# Patient Record
Sex: Male | Born: 1965 | Race: White | Hispanic: No | Marital: Married | Smoking: Former smoker
Health system: Southern US, Community
[De-identification: ages and names within clinical notes are randomized; demographics above are authoritative.]

## PROBLEM LIST (undated history)

## (undated) DIAGNOSIS — Z8781 Personal history of (healed) traumatic fracture: Secondary | ICD-10-CM

## (undated) DIAGNOSIS — E7800 Pure hypercholesterolemia, unspecified: Secondary | ICD-10-CM

## (undated) DIAGNOSIS — S129XXA Fracture of neck, unspecified, initial encounter: Secondary | ICD-10-CM

## (undated) DIAGNOSIS — E785 Hyperlipidemia, unspecified: Secondary | ICD-10-CM

## (undated) DIAGNOSIS — Z720 Tobacco use: Secondary | ICD-10-CM

## (undated) DIAGNOSIS — I739 Peripheral vascular disease, unspecified: Secondary | ICD-10-CM

## (undated) DIAGNOSIS — S02609A Fracture of mandible, unspecified, initial encounter for closed fracture: Secondary | ICD-10-CM

## (undated) DIAGNOSIS — B019 Varicella without complication: Secondary | ICD-10-CM

## (undated) DIAGNOSIS — N200 Calculus of kidney: Secondary | ICD-10-CM

## (undated) HISTORY — DX: Tobacco use: Z72.0

## (undated) HISTORY — DX: Varicella without complication: B01.9

## (undated) HISTORY — DX: Peripheral vascular disease, unspecified: I73.9

## (undated) HISTORY — PX: CERVICAL FUSION: SHX112

## (undated) HISTORY — DX: Personal history of (healed) traumatic fracture: Z87.81

## (undated) HISTORY — DX: Hyperlipidemia, unspecified: E78.5

## (undated) HISTORY — DX: Fracture of mandible, unspecified, initial encounter for closed fracture: S02.609A

## (undated) HISTORY — DX: Calculus of kidney: N20.0

---
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
## 1988-10-20 DIAGNOSIS — N200 Calculus of kidney: Secondary | ICD-10-CM

## 1988-10-20 HISTORY — DX: Calculus of kidney: N20.0

## 1988-10-20 HISTORY — PX: OTHER SURGICAL HISTORY: SHX169

## 2002-10-20 HISTORY — PX: OTHER SURGICAL HISTORY: SHX169

## 2005-08-17 ENCOUNTER — Emergency Department (HOSPITAL_COMMUNITY): Admission: EM | Admit: 2005-08-17 | Discharge: 2005-08-17 | Payer: Self-pay | Admitting: Family Medicine

## 2005-10-20 HISTORY — PX: OTHER SURGICAL HISTORY: SHX169

## 2005-10-30 ENCOUNTER — Ambulatory Visit (HOSPITAL_COMMUNITY): Admission: RE | Admit: 2005-10-30 | Discharge: 2005-10-31 | Payer: Self-pay | Admitting: Neurosurgery

## 2006-10-18 IMAGING — CR DG SPINE 1V PORT
1 series · 1 of 1 positions shown · non-contrast
Comparison: none

CLINICAL DATA: Localization for C5-6 fusion.
 PORTABLE SPINE ? 1 VIEW:

[view not recorded]
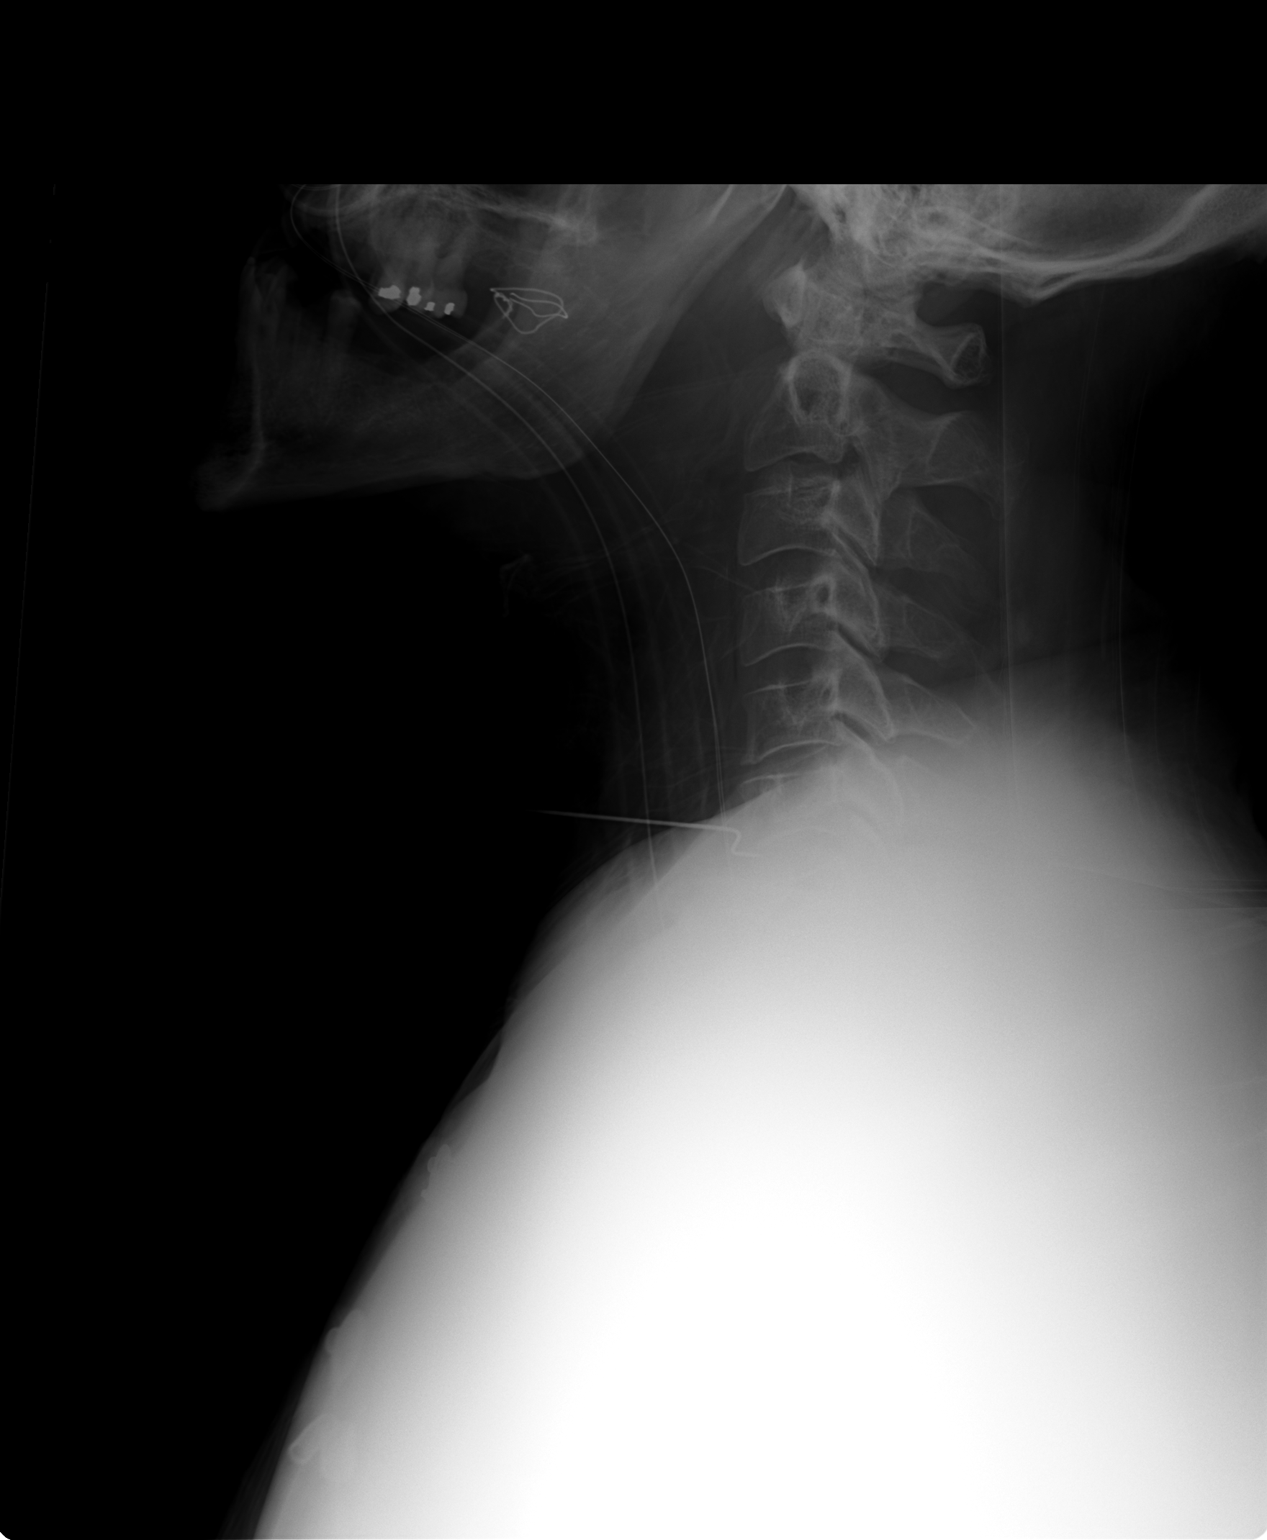

[1 of 1 positions shown; findings below may reference images not displayed]

FINDINGS: Film #1 at 2153 hours shows the C6-7 interspace is marked from an anterior approach.
IMPRESSION: C6-7 localized. 
 PORTABLE SPINE ? 1 VIEW:
FINDINGS: Film #2 at 0873 hours shows a needle now positioned anterior to the C5-6 disc space.
IMPRESSION: C5-6 localized.

## 2006-10-18 IMAGING — CR DG SPINE 1V PORT
1 series · 1 of 1 positions shown · non-contrast
Comparison: none

CLINICAL DATA: C5-6 ACDF.  HNP.  Radiculopathy.  
 PORTABLE CERVICAL SPINE VIEW AT [DATE] HOURS ? 1 VIEW:

[view not recorded]
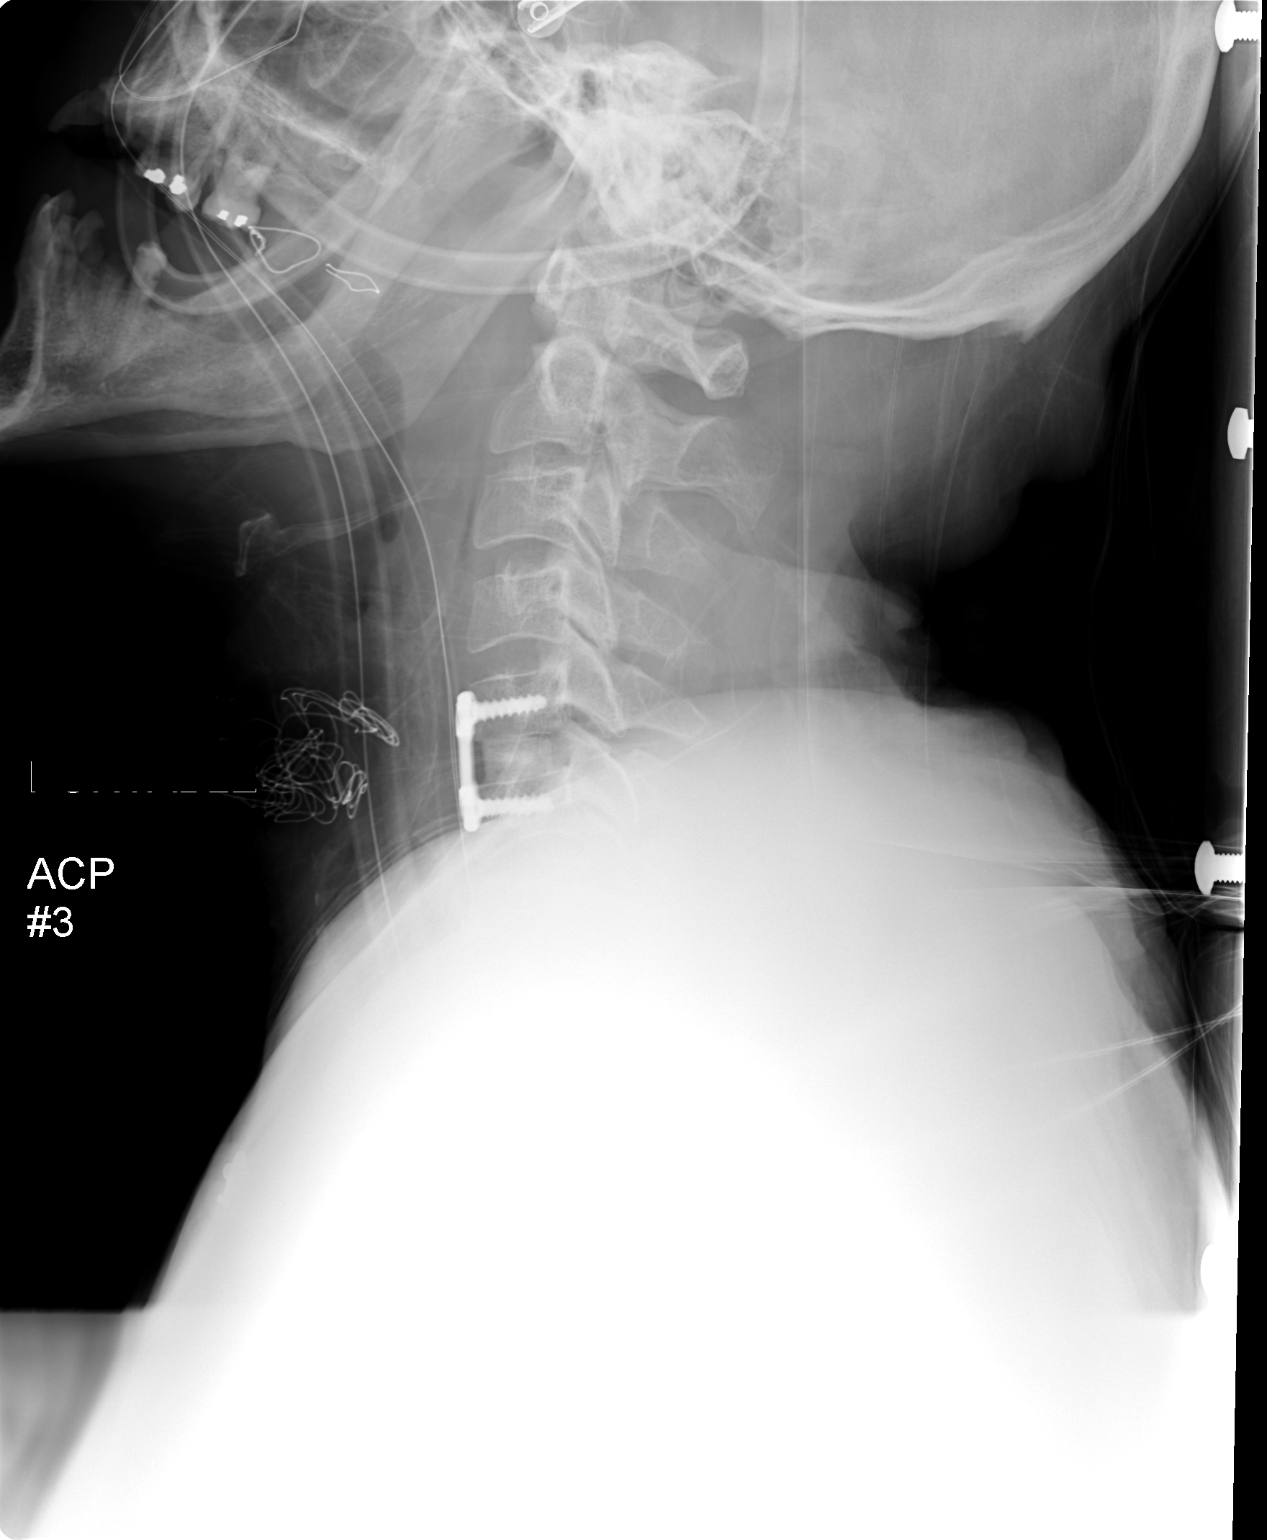

[1 of 1 positions shown; findings below may reference images not displayed]

FINDINGS: This film is labeled number three and reveals satisfactory appearance of anterior plate and screw fixation hardware and interbody fusion plug at C5-6.  No subluxation.
IMPRESSION: Satisfactory appearance of ACDF at C5-6.

## 2006-10-18 IMAGING — CR DG SPINE 1V PORT
1 series · 1 of 1 positions shown · non-contrast
Comparison: none

CLINICAL DATA: Localization for C5-6 fusion.
 PORTABLE SPINE ? 1 VIEW:

[view not recorded]
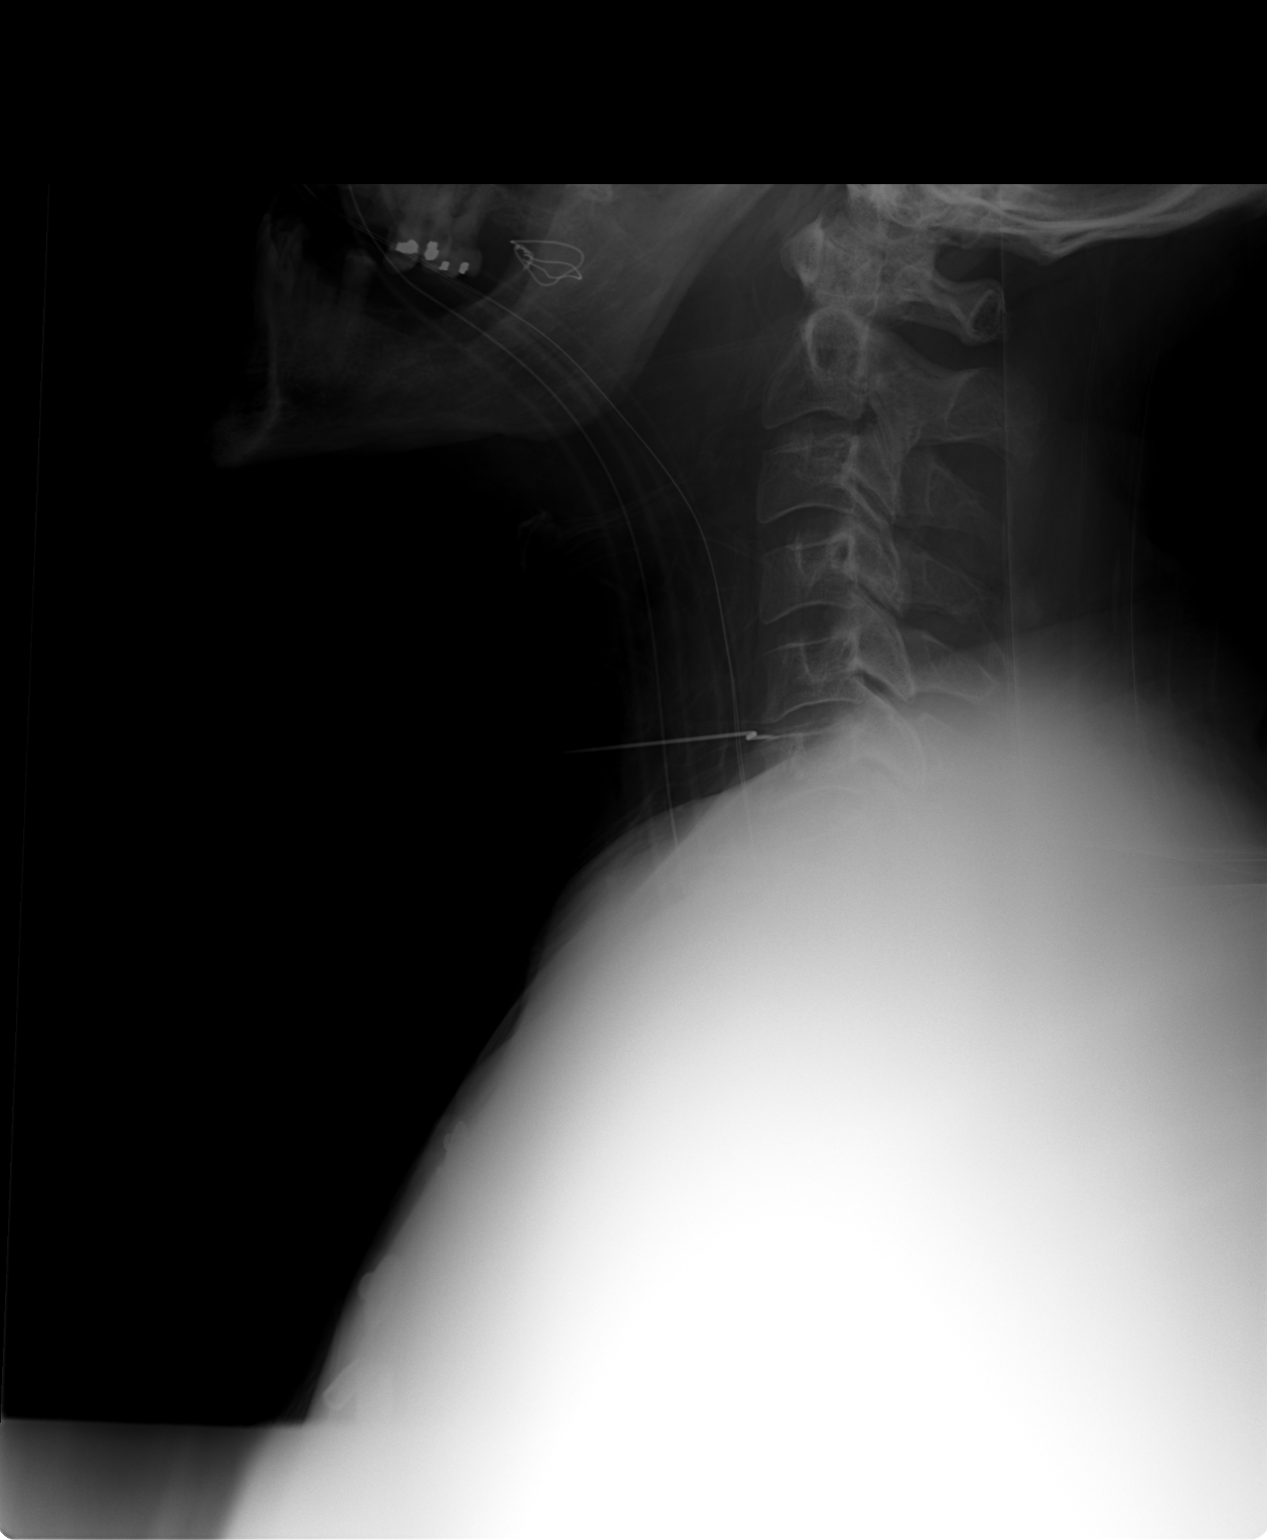

[1 of 1 positions shown; findings below may reference images not displayed]

FINDINGS: Film #1 at 2153 hours shows the C6-7 interspace is marked from an anterior approach.
IMPRESSION: C6-7 localized. 
 PORTABLE SPINE ? 1 VIEW:
FINDINGS: Film #2 at 0873 hours shows a needle now positioned anterior to the C5-6 disc space.
IMPRESSION: C5-6 localized.

## 2009-04-16 ENCOUNTER — Ambulatory Visit: Payer: Self-pay | Admitting: Family Medicine

## 2009-04-16 DIAGNOSIS — H9190 Unspecified hearing loss, unspecified ear: Secondary | ICD-10-CM | POA: Insufficient documentation

## 2009-04-16 DIAGNOSIS — N508 Other specified disorders of male genital organs: Secondary | ICD-10-CM

## 2009-04-16 DIAGNOSIS — G4730 Sleep apnea, unspecified: Secondary | ICD-10-CM | POA: Insufficient documentation

## 2009-04-18 ENCOUNTER — Telehealth (INDEPENDENT_AMBULATORY_CARE_PROVIDER_SITE_OTHER): Payer: Self-pay | Admitting: *Deleted

## 2009-04-18 LAB — CONVERTED CEMR LAB
ALT: 18 units/L (ref 0–53)
AST: 20 units/L (ref 0–37)
Albumin: 4 g/dL (ref 3.5–5.2)
Alkaline Phosphatase: 46 units/L (ref 39–117)
Basophils Absolute: 0 10*3/uL (ref 0.0–0.1)
Basophils Relative: 0 % (ref 0.0–3.0)
Bilirubin, Direct: 0 mg/dL (ref 0.0–0.3)
CO2: 26 meq/L (ref 19–32)
Calcium: 9 mg/dL (ref 8.4–10.5)
Chloride: 110 meq/L (ref 96–112)
Creatinine, Ser: 0.9 mg/dL (ref 0.4–1.5)
Direct LDL: 162.2 mg/dL
Eosinophils Relative: 3.9 % (ref 0.0–5.0)
GFR calc non Af Amer: 97.37 mL/min (ref >60)
Glucose, Bld: 111 mg/dL — ABNORMAL HIGH (ref 70–99)
HCT: 44.9 % (ref 39.0–52.0)
HDL: 34.1 mg/dL — ABNORMAL LOW (ref >39.00)
Hemoglobin: 15.8 g/dL (ref 13.0–17.0)
Lymphocytes Relative: 32.6 % (ref 12.0–46.0)
Lymphs Abs: 2.3 10*3/uL (ref 0.7–4.0)
Monocytes Absolute: 0.7 10*3/uL (ref 0.1–1.0)
Monocytes Relative: 9.7 % (ref 3.0–12.0)
Neutro Abs: 3.8 10*3/uL (ref 1.4–7.7)
Neutrophils Relative %: 53.8 % (ref 43.0–77.0)
Potassium: 3.8 meq/L (ref 3.5–5.1)
RBC: 4.89 M/uL (ref 4.22–5.81)
RDW: 12.6 % (ref 11.5–14.6)
Sodium: 143 meq/L (ref 135–145)
TSH: 1.77 microintl units/mL (ref 0.35–5.50)
Total Bilirubin: 0.6 mg/dL (ref 0.3–1.2)
Total CHOL/HDL Ratio: 6
Total Protein: 6.8 g/dL (ref 6.0–8.3)
Triglycerides: 70 mg/dL (ref 0.0–149.0)
VLDL: 14 mg/dL (ref 0.0–40.0)
WBC: 7.1 10*3/uL (ref 4.5–10.5)

## 2010-11-11 ENCOUNTER — Encounter: Payer: Self-pay | Admitting: Family Medicine
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
## 2012-05-19 ENCOUNTER — Ambulatory Visit: Payer: Self-pay | Admitting: Family Medicine

## 2012-05-19 ENCOUNTER — Ambulatory Visit (INDEPENDENT_AMBULATORY_CARE_PROVIDER_SITE_OTHER): Payer: BC Managed Care – PPO | Admitting: Family Medicine

## 2012-05-19 ENCOUNTER — Ambulatory Visit (INDEPENDENT_AMBULATORY_CARE_PROVIDER_SITE_OTHER)
Admission: RE | Admit: 2012-05-19 | Discharge: 2012-05-19 | Disposition: A | Discharge status: Home / Self Care | Payer: BC Managed Care – PPO | Source: Ambulatory Visit | Attending: Family Medicine | Admitting: Family Medicine

## 2012-05-19 ENCOUNTER — Encounter: Payer: Self-pay | Admitting: Family Medicine

## 2012-05-19 VITALS — BP 115/78 | HR 79 | Temp 97.6°F | Ht 77.0 in | Wt 216.8 lb

## 2012-05-19 DIAGNOSIS — Z8781 Personal history of (healed) traumatic fracture: Secondary | ICD-10-CM

## 2012-05-19 DIAGNOSIS — Z720 Tobacco use: Secondary | ICD-10-CM

## 2012-05-19 DIAGNOSIS — F17200 Nicotine dependence, unspecified, uncomplicated: Secondary | ICD-10-CM

## 2012-05-19 DIAGNOSIS — N508 Other specified disorders of male genital organs: Secondary | ICD-10-CM

## 2012-05-19 DIAGNOSIS — J189 Pneumonia, unspecified organism: Secondary | ICD-10-CM

## 2012-05-19 DIAGNOSIS — N200 Calculus of kidney: Secondary | ICD-10-CM

## 2012-05-19 DIAGNOSIS — E785 Hyperlipidemia, unspecified: Secondary | ICD-10-CM | POA: Insufficient documentation

## 2012-05-19 HISTORY — DX: Tobacco use: Z72.0

## 2012-05-19 NOTE — Patient Instructions (Signed)
Cough, Adult  A cough is a reflex that helps clear your throat and airways. It can help heal the body or may be a reaction to an irritated airway. A cough may only last 2 or 3 weeks (acute) or may last more than 8 weeks (chronic).  CAUSES Acute cough:  Viral or bacterial infections.  Chronic cough:  Infections.   Allergies.   Asthma.   Post-nasal drip.   Smoking.   Heartburn or acid reflux.   Some medicines.   Chronic lung problems (COPD).   Cancer.  SYMPTOMS   Cough.   Fever.   Chest pain.   Increased breathing rate.   High-pitched whistling sound when breathing (wheezing).   Colored mucus that you cough up (sputum).  TREATMENT   A bacterial cough may be treated with antibiotic medicine.   A viral cough must run its course and will not respond to antibiotics.   Your caregiver may recommend other treatments if you have a chronic cough.  HOME CARE INSTRUCTIONS   Only take over-the-counter or prescription medicines for pain, discomfort, or fever as directed by your caregiver. Use cough suppressants only as directed by your caregiver.   Use a cold steam vaporizer or humidifier in your bedroom or home to help loosen secretions.   Sleep in a semi-upright position if your cough is worse at night.   Rest as needed.   Stop smoking if you smoke.  SEEK IMMEDIATE MEDICAL CARE IF:   You have pus in your sputum.   Your cough starts to worsen.   You cannot control your cough with suppressants and are losing sleep.   You begin coughing up blood.   You have difficulty breathing.   You develop pain which is getting worse or is uncontrolled with medicine.   You have a fever.  MAKE SURE YOU:   Understand these instructions.   Will watch your condition.   Will get help right away if you are not doing well or get worse.  Document Released: 04/04/2011 Document Revised: 09/25/2011 Document Reviewed: 04/04/2011 [facility] Patient Information 2012 [place],  [place].Preventive Care for Adults, Male A healthy lifestyle and preventative care can promote health and wellness. Preventative health guidelines for men include the following key practices:  A routine yearly physical is a good way to check with your caregiver about your health and preventative screening. It is a chance to share any concerns and updates on your health, and to receive a thorough exam.   Visit your dentist for a routine exam and preventative care every 6 months. Brush your teeth twice a day and floss once a day. Good oral hygiene prevents tooth decay and gum disease.   The frequency of eye exams is based on your age, health, family medical history, use of contact lenses, and other factors. Follow your caregiver's recommendations for frequency of eye exams.   Eat a healthy diet. Foods like vegetables, fruits, whole grains, low-fat dairy products, and lean protein foods contain the nutrients you need without too many calories. Decrease your intake of foods high in solid fats, added sugars, and salt. Eat the right amount of calories for you.Get information about a proper diet from your caregiver, if necessary.   Regular physical exercise is one of the most important things you can do for your health. Most adults should get at least 150 minutes of moderate-intensity exercise (any activity that increases your heart rate and causes you to sweat) each week. In addition, most adults need muscle-strengthening exercises  on 2 or more days a week.   Maintain a healthy weight. The body mass index (BMI) is a screening tool to identify possible weight problems. It provides an estimate of body fat based on height and weight. Your caregiver can help determine your BMI, and can help you achieve or maintain a healthy weight.For adults 20 years and older:   A BMI below 18.5 is considered underweight.   A BMI of 18.5 to 24.9 is normal.   A BMI of 25 to 29.9 is considered overweight.   A BMI of 30 and  above is considered obese.   Maintain normal blood lipids and cholesterol levels by exercising and minimizing your intake of saturated fat. Eat a balanced diet with plenty of fruit and vegetables. Blood tests for lipids and cholesterol should begin at age 52 and be repeated every 5 years. If your lipid or cholesterol levels are high, you are over 50, or you are a high risk for heart disease, you may need your cholesterol levels checked more frequently.Ongoing high lipid and cholesterol levels should be treated with medicines if diet and exercise are not effective.   If you smoke, find out from your caregiver how to quit. If you do not use tobacco, do not start.   If you choose to drink alcohol, do not exceed 2 drinks per day. One drink is considered to be 12 ounces (355 mL) of beer, 5 ounces (148 mL) of wine, or 1.5 ounces (44 mL) of liquor.   Avoid use of street drugs. Do not share needles with anyone. Ask for help if you need support or instructions about stopping the use of drugs.   High blood pressure causes heart disease and increases the risk of stroke. Your blood pressure should be checked at least every 1 to 2 years. Ongoing high blood pressure should be treated with medicines, if weight loss and exercise are not effective.   If you are 80 to 47 years old, ask your caregiver if you should take aspirin to prevent heart disease.   Diabetes screening involves taking a blood sample to check your fasting blood sugar level. This should be done once every 3 years, after age 32, if you are within normal weight and without risk factors for diabetes. Testing should be considered at a younger age or be carried out more frequently if you are overweight and have at least 1 risk factor for diabetes.   Colorectal cancer can be detected and often prevented. Most routine colorectal cancer screening begins at the age of 21 and continues through age 57. However, your caregiver may recommend screening at an  earlier age if you have risk factors for colon cancer. On a yearly basis, your caregiver may provide home test kits to check for hidden blood in the stool. Use of a small camera at the end of a tube, to directly examine the colon (sigmoidoscopy or colonoscopy), can detect the earliest forms of colorectal cancer. Talk to your caregiver about this at age 55, when routine screening begins. Direct examination of the colon should be repeated every 5 to 10 years through age 52, unless early forms of pre-cancerous polyps or small growths are found.   Hepatitis C blood testing is recommended for all people born from [birth year] through 1965 and any individual with known risks for hepatitis C.   Practice safe sex. Use condoms and avoid high-risk sexual practices to reduce the spread of sexually transmitted infections (STIs). STIs include gonorrhea, chlamydia, syphilis, trichomonas,  herpes, HPV, and human immunodeficiency virus (HIV). Herpes, HIV, and HPV are viral illnesses that have no cure. They can result in disability, cancer, and death.   A one-time screening for abdominal aortic aneurysm (AAA) and surgical repair of large AAAs by sound wave imaging (ultrasonography) is recommended for ages 58 to 61 years who are current or former smokers.   Healthy men should no longer receive prostate-specific antigen (PSA) blood tests as part of routine cancer screening. Consult with your caregiver about prostate cancer screening.   Testicular cancer screening is not recommended for adult males who have no symptoms. Screening includes self-exam, caregiver exam, and other screening tests. Consult with your caregiver about any symptoms you have or any concerns you have about testicular cancer.   Use sunscreen with skin protection factor (SPF) of 30 or more. Apply sunscreen liberally and repeatedly throughout the day. You should seek shade when your shadow is shorter than you. Protect yourself by wearing long sleeves, pants, a  wide-brimmed hat, and sunglasses year round, whenever you are outdoors.   Once a month, do a whole body skin exam, using a mirror to look at the skin on your back. Notify your caregiver of new moles, moles that have irregular borders, moles that are larger than a pencil eraser, or moles that have changed in shape or color.   Stay current with required immunizations.   Influenza. You need a dose every fall (or winter). The composition of the flu vaccine changes each year, so being vaccinated once is not enough.   Pneumococcal polysaccharide. You need 1 to 2 doses if you smoke cigarettes or if you have certain chronic medical conditions. You need 1 dose at age 62 (or older) if you have never been vaccinated.   Tetanus, diphtheria, pertussis (Tdap, Td). Get 1 dose of Tdap vaccine if you are younger than age 34 years, are over 43 and have contact with an infant, are a Research scientist (physical sciences), or simply want to be protected from whooping cough. After that, you need a Td booster dose every 10 years. Consult your caregiver if you have not had at least 3 tetanus and diphtheria-containing shots sometime in your life or have a deep or dirty wound.   HPV. This vaccine is recommended for males 13 through 47 years of age. This vaccine may be given to men 22 through 47 years of age who have not completed the 3 dose series. It is recommended for men through age 68 who have sex with men or whose immune system is weakened because of HIV infection, other illness, or medications. The vaccine is given in 3 doses over 6 months.   Measles, mumps, rubella (MMR). You need at least 1 dose of MMR if you were born in 1957 or later. You may also need a 2nd dose.   Meningococcal. If you are age 63 to 3 years and a Orthoptist living in a residence hall, or have one of several medical conditions, you need to get vaccinated against meningococcal disease. You may also need additional booster doses.   Zoster (shingles).  If you are age 108 years or older, you should get this vaccine.   Varicella (chickenpox). If you have never had chickenpox or you were vaccinated but received only 1 dose, talk to your caregiver to find out if you need this vaccine.   Hepatitis A. You need this vaccine if you have a specific risk factor for hepatitis A virus infection, or you simply wish to  be protected from this disease. The vaccine is usually given as 2 doses, 6 to 18 months apart.   Hepatitis B. You need this vaccine if you have a specific risk factor for hepatitis B virus infection or you simply wish to be protected from this disease. The vaccine is given in 3 doses, usually over 6 months.  Preventative Service / Frequency Ages 64 to 58  Blood pressure check.** / Every 1 to 2 years.   Lipid and cholesterol check.** / Every 5 years beginning at age 45.   Hepatitis C blood test.** / For any individual with known risks for hepatitis C.   Skin self-exam. / Monthly.   Influenza immunization.** / Every year.   Pneumococcal polysaccharide immunization.** / 1 to 2 doses if you smoke cigarettes or if you have certain chronic medical conditions.   Tetanus, diphtheria, pertussis (Tdap,Td) immunization. / A one-time dose of Tdap vaccine. After that, you need a Td booster dose every 10 years.   HPV immunization. / 3 doses over 6 months, if 26 and younger.   Measles, mumps, rubella (MMR) immunization. / You need at least 1 dose of MMR if you were born in 1957 or later. You may also need a 2nd dose.   Meningococcal immunization. / 1 dose if you are age 40 to 54 years and a Orthoptist living in a residence hall, or have one of several medical conditions, you need to get vaccinated against meningococcal disease. You may also need additional booster doses.   Varicella immunization.** / Consult your caregiver.   Hepatitis A immunization.** / Consult your caregiver. 2 doses, 6 to 18 months apart.   Hepatitis B  immunization.** / Consult your caregiver. 3 doses usually over 6 months.  Ages 22 to 39  Blood pressure check.** / Every 1 to 2 years.   Lipid and cholesterol check.** / Every 5 years beginning at age 42.   Fecal occult blood test (FOBT) of stool. / Every year beginning at age 15 and continuing until age 53. You may not have to do this test if you get colonoscopy every 10 years.   Flexible sigmoidoscopy** or colonoscopy.** / Every 5 years for a flexible sigmoidoscopy or every 10 years for a colonoscopy beginning at age 82 and continuing until age 26.   Hepatitis C blood test.** / For all people born from [birth year] through 1965 and any individual with known risks for hepatitis C.   Skin self-exam. / Monthly.   Influenza immunization.** / Every year.   Pneumococcal polysaccharide immunization.** / 1 to 2 doses if you smoke cigarettes or if you have certain chronic medical conditions.   Tetanus, diphtheria, pertussis (Tdap/Td) immunization.** / A one-time dose of Tdap vaccine. After that, you need a Td booster dose every 10 years.   Measles, mumps, rubella (MMR) immunization. / You need at least 1 dose of MMR if you were born in 1957 or later. You may also need a 2nd dose.   Varicella immunization.**/ Consult your caregiver.   Meningococcal immunization.** / Consult your caregiver.   Hepatitis A immunization.** / Consult your caregiver. 2 doses, 6 to 18 months apart.   Hepatitis B immunization.** / Consult your caregiver. 3 doses, usually over 6 months.  Ages 5 and over  Blood pressure check.** / Every 1 to 2 years.   Lipid and cholesterol check.**/ Every 5 years beginning at age 28.   Fecal occult blood test (FOBT) of stool. / Every year beginning at age 41  and continuing until age 52. You may not have to do this test if you get colonoscopy every 10 years.   Flexible sigmoidoscopy** or colonoscopy.** / Every 5 years for a flexible sigmoidoscopy or every 10 years for a colonoscopy  beginning at age 74 and continuing until age 41.   Hepatitis C blood test.** / For all people born from [birth year] through 1966 and any individual with known risks for hepatitis C.   Abdominal aortic aneurysm (AAA) screening.** / A one-time screening for ages 45 to 68 years who are current or former smokers.   Skin self-exam. / Monthly.   Influenza immunization.** / Every year.   Pneumococcal polysaccharide immunization.** / 1 dose at age 24 (or older) if you have never been vaccinated.   Tetanus, diphtheria, pertussis (Tdap, Td) immunization. / A one-time dose of Tdap vaccine if you are over 65 and have contact with an infant, are a Research scientist (physical sciences), or simply want to be protected from whooping cough. After that, you need a Td booster dose every 10 years.   Varicella immunization. ** / Consult your caregiver.   Meningococcal immunization.** / Consult your caregiver.   Hepatitis A immunization. ** / Consult your caregiver. 2 doses, 6 to 18 months apart.   Hepatitis B immunization.** / Check with your caregiver. 3 doses, usually over 6 months.  **Family history and personal history of risk and conditions may change your caregiver's recommendations. Document Released: 12/02/2001 Document Revised: 09/25/2011 Document Reviewed: 03/03/2011 [facility] Patient Information 2012 [place], [place].

## 2012-05-19 NOTE — Assessment & Plan Note (Addendum)
Was asymptomatic in August now for roughly 7 months  Has been having pain over posterior left lower ribs now beginning to have pain left lower anterior ribs. CXR normal can proceed with CT scan if more respiratory symptoms develop. Consider muscle strain vs pinched nerve and try moist heat and gentle stretching. Aspercreme prn and return as scheduled or sooner if symptoms develop

## 2012-05-20 ENCOUNTER — Telehealth: Payer: Self-pay

## 2012-05-20 MED ORDER — CYCLOBENZAPRINE HCL 10 MG PO TABS: 10.0000 mg | ORAL_TABLET | Freq: Every evening | ORAL | Status: DC | PRN

## 2012-05-20 NOTE — Telephone Encounter (Signed)
RX sent

## 2012-05-21 NOTE — Assessment & Plan Note (Signed)
He believes he had a fusion in the upper cervical spine, possibly c1/c2.

## 2012-05-21 NOTE — Assessment & Plan Note (Signed)
Mild avoid trans fats, minimize simple carbs and saturated fats, increase exercise, add Krill oil

## 2012-05-21 NOTE — Assessment & Plan Note (Signed)
Discussed at length the need for complete cessation. He actually says he has a theory that people get lung cancer when they quit smoking. He is still encouraged to quit and told that he statistically speaking he is much more likely to cancer or develop any other illness if he keeps smoking. He is going to consider Xyban and e cig

## 2012-05-21 NOTE — Assessment & Plan Note (Signed)
Patient notes it is persistent and not changing, consider Ultrasound, patient offered scheduling today and declines.

## 2012-05-21 NOTE — Assessment & Plan Note (Signed)
No recent episodes, reminded to maintain adequate hydration

## 2012-05-21 NOTE — Progress Notes (Signed)
Patient ID: Brett Glover, male   DOB: February 05, 1966, 46 y.o.   MRN: [id number] TRAQUAN DUARTE [id number] [birth year] 05/21/2012      Progress Note New Patient  Subjective  Chief Complaint  Chief Complaint  Patient presents with  . Establish Care    new patient- little congestion- chest xray last Aug 2012  and was told he maybe had pneumonia but never took an antibiotic. feels like someone is punching him in back X 1 year    HPI  46 year old Caucasian male who is in today for new patient appointment. He is concerned about some chest wall tenderness on the left side. He reports during work physical last August he was told on x-ray that he had pneumonia. He denies having had any significant symptoms of illness at that time. He denies any fevers, chills, congestion, chest pain, shortness of breath at that time. Today he is reporting a very small amount of congestion in his head but otherwise the only other complaint is his pain which over the last several months has been in his left posterior mid and on but is now radiating around to the front in the left upper quadrant. He describes is mostly dull but occasionally a little sharper. No changes in position seem to affect it. No shortness of breath or cough is associated. He is a smoker and he also served in [employer] but denies any recollection of any significant smoke or chemical exposures. He otherwise overall feels well. No recent illness, fevers, chills, malaise, anorexia, chest pain, palpitations, shortness of breath, GI or GU complaints.  Past Medical History  Diagnosis Date  . Chicken pox as a child  . Broken jaw 1983  . Kidney stones 1990  . Tobacco abuse 05/19/2012  . History of cervical fracture   . Hyperlipidemia     Past Surgical History  Procedure Date  . Fusion in neck 2004  . Fatty tissue removed 2007    left abdominal wall, lipoma  . Kidney stones removed 1990    Family History  Problem Relation Age of Onset  .  Seizures Mother   . ADD / ADHD Daughter   . Mental illness Daughter     bipolar, add  . Heart disease Sister     History   Social History  . Marital Status: Single    Spouse Name: N/A    Number of Children: N/A  . Years of Education: N/A   Occupational History  . Not on file.   Social History Main Topics  . Smoking status: Current Everyday Smoker -- 1.0 packs/day for 30 years    Types: Cigarettes  . Smokeless tobacco: Never Used  . Alcohol Use: No  . Drug Use: No  . Sexually Active: Yes   Other Topics Concern  . Not on file   Social History Narrative  . No narrative on file    No current outpatient prescriptions on file prior to visit.    No Known Allergies  Review of Systems  Review of Systems  Constitutional: Negative for fever, chills and malaise/fatigue.  HENT: Negative for hearing loss, nosebleeds and congestion.   Eyes: Negative for discharge.  Respiratory: Negative for cough, sputum production, shortness of breath and wheezing.   Cardiovascular: Positive for chest pain. Negative for palpitations and leg swelling.  Gastrointestinal: Negative for heartburn, nausea, vomiting, abdominal pain, diarrhea, constipation and blood in stool.  Genitourinary: Negative for dysuria, urgency, frequency and hematuria.  Musculoskeletal: Negative for myalgias, back pain  and falls.  Skin: Negative for rash.  Neurological: Negative for dizziness, tremors, sensory change, focal weakness, loss of consciousness, weakness and headaches.  Endo/Heme/Allergies: Negative for polydipsia. Does not bruise/bleed easily.  Psychiatric/Behavioral: Negative for depression and suicidal ideas. The patient is not nervous/anxious and does not have insomnia.     Objective  BP 115/78  Pulse 79  Temp 97.6 F (36.4 C) (Temporal)  Ht 6\' 5"  (1.956 m)  Wt 216 lb 12.8 oz (98.34 kg)  BMI 25.71 kg/m2  SpO2 98%  Physical Exam  Physical Exam  Constitutional: He is oriented to person, place,  and time and well-developed, well-nourished, and in no distress. No distress.  HENT:  Head: Normocephalic and atraumatic.  Right Ear: External ear normal.  Left Ear: External ear normal.  Nose: Nose normal.  Mouth/Throat: Oropharynx is clear and moist.  Eyes: Conjunctivae and EOM are normal. Pupils are equal, round, and reactive to light. Right eye exhibits no discharge. Left eye exhibits no discharge.  Neck: Normal range of motion. Neck supple. No thyromegaly present.  Cardiovascular: Normal rate, regular rhythm and normal heart sounds.   No murmur heard. Pulmonary/Chest: Effort normal and breath sounds normal. No respiratory distress. He has no wheezes. He has no rales.  Abdominal: He exhibits no distension and no mass. There is no tenderness.  Musculoskeletal: He exhibits no edema and no tenderness.  Lymphadenopathy:    He has no cervical adenopathy.  Neurological: He is alert and oriented to person, place, and time. He has normal reflexes. He displays normal reflexes. No cranial nerve deficit. Gait normal. Coordination normal. GCS score is 15.  Skin: Skin is warm and dry. No rash noted. No erythema.  Psychiatric: Memory, affect and judgment normal.       Assessment & Plan  Pneumonia Was asymptomatic in August now for roughly 7 months  Has been having pain over posterior left lower ribs now beginning to have pain left lower anterior ribs. CXR normal can proceed with CT scan if more respiratory symptoms develop. Consider muscle strain vs pinched nerve and try moist heat and gentle stretching. Aspercreme prn and return as scheduled or sooner if symptoms develop  TESTICULAR MASS, LEFT Patient notes it is persistent and not changing, consider Ultrasound, patient offered scheduling today and declines.   Hyperlipidemia Mild avoid trans fats, minimize simple carbs and saturated fats, increase exercise, add Krill oil  Tobacco abuse Discussed at length the need for complete cessation.  He actually says he has a theory that people get lung cancer when they quit smoking. He is still encouraged to quit and told that he statistically speaking he is much more likely to cancer or develop any other illness if he keeps smoking. He is going to consider Xyban and e cig  History of cervical fracture He believes he had a fusion in the upper cervical spine, possibly c1/c2.   Kidney stones No recent episodes, reminded to maintain adequate hydration

## 2012-08-13 ENCOUNTER — Emergency Department (HOSPITAL_BASED_OUTPATIENT_CLINIC_OR_DEPARTMENT_OTHER)
Admission: EM | Admit: 2012-08-13 | Discharge: 2012-08-13 | Disposition: A | Discharge status: Home / Self Care | Payer: BC Managed Care – PPO | Attending: Emergency Medicine | Admitting: Emergency Medicine

## 2012-08-13 ENCOUNTER — Encounter (HOSPITAL_BASED_OUTPATIENT_CLINIC_OR_DEPARTMENT_OTHER): Payer: Self-pay | Admitting: *Deleted

## 2012-08-13 DIAGNOSIS — IMO0002 Reserved for concepts with insufficient information to code with codable children: Secondary | ICD-10-CM

## 2012-08-13 DIAGNOSIS — Z8739 Personal history of other diseases of the musculoskeletal system and connective tissue: Secondary | ICD-10-CM | POA: Insufficient documentation

## 2012-08-13 DIAGNOSIS — Z8639 Personal history of other endocrine, nutritional and metabolic disease: Secondary | ICD-10-CM | POA: Insufficient documentation

## 2012-08-13 DIAGNOSIS — Z862 Personal history of diseases of the blood and blood-forming organs and certain disorders involving the immune mechanism: Secondary | ICD-10-CM | POA: Insufficient documentation

## 2012-08-13 DIAGNOSIS — F17200 Nicotine dependence, unspecified, uncomplicated: Secondary | ICD-10-CM | POA: Insufficient documentation

## 2012-08-13 DIAGNOSIS — Y9289 Other specified places as the place of occurrence of the external cause: Secondary | ICD-10-CM | POA: Insufficient documentation

## 2012-08-13 DIAGNOSIS — S61209A Unspecified open wound of unspecified finger without damage to nail, initial encounter: Secondary | ICD-10-CM | POA: Insufficient documentation

## 2012-08-13 DIAGNOSIS — Z791 Long term (current) use of non-steroidal anti-inflammatories (NSAID): Secondary | ICD-10-CM | POA: Insufficient documentation

## 2012-08-13 DIAGNOSIS — Y93G9 Activity, other involving cooking and grilling: Secondary | ICD-10-CM | POA: Insufficient documentation

## 2012-08-13 DIAGNOSIS — W260XXA Contact with knife, initial encounter: Secondary | ICD-10-CM | POA: Insufficient documentation

## 2012-08-13 DIAGNOSIS — Z87442 Personal history of urinary calculi: Secondary | ICD-10-CM | POA: Insufficient documentation

## 2012-08-13 DIAGNOSIS — Z79899 Other long term (current) drug therapy: Secondary | ICD-10-CM | POA: Insufficient documentation

## 2012-08-13 MED ORDER — HYDROCODONE-ACETAMINOPHEN 5-325 MG PO TABS: 2.0000 | ORAL_TABLET | ORAL | Status: DC | PRN

## 2012-08-13 MED ORDER — LIDOCAINE HCL 2 % IJ SOLN
INTRAMUSCULAR | Status: AC
  Administered 2012-08-13: 400 mg
  Filled 2012-08-13: qty 20

## 2012-08-13 NOTE — ED Notes (Signed)
Lac to left pointer finger while cutting meat, dressing intact upon arrival

## 2012-08-13 NOTE — ED Notes (Signed)
Pt requesting rx for pain medication. PA aware and rx written.

## 2012-08-13 NOTE — ED Provider Notes (Signed)
History     CSN: [id number]  Arrival date & time 08/13/12  1950   First MD Initiated Contact with Patient 08/13/12 1952      Chief Complaint  Patient presents with  . Laceration    (Consider location/radiation/quality/duration/timing/severity/associated sxs/prior treatment) HPI Comments: .Brett Glover 46 y.o. male   The chief complaint is: Patient presents with:   Laceration    46 year old male presents to the ED with chief complaint of laceration to the left index finger.  Patient was cooking tonight when he sliced his finger with his cooking knife.  He is up-to-date on his tetanus.  Last tetanus vaccine was in 2006.  Patient is able to move his finger fully.  He denies any numbness or tingling in the finger.  He does have significant throbbing pain.     Patient is a 46 y.o. male presenting with skin laceration. The history is provided by the patient and the EMS personnel. No language interpreter was used.  Laceration     Past Medical History  Diagnosis Date  . Chicken pox as a child  . Broken jaw 1983  . Kidney stones 1990  . Tobacco abuse 05/19/2012  . History of cervical fracture   . Hyperlipidemia     Past Surgical History  Procedure Date  . Fusion in neck 2004  . Fatty tissue removed 2007    left abdominal wall, lipoma  . Kidney stones removed 1990    Family History  Problem Relation Age of Onset  . Seizures Mother   . ADD / ADHD Daughter   . Mental illness Daughter     bipolar, add  . Heart disease Sister     History  Substance Use Topics  . Smoking status: Current Every Day Smoker -- 1.0 packs/day for 30 years    Types: Cigarettes  . Smokeless tobacco: Never Used  . Alcohol Use: No      Review of Systems  Constitutional: Negative for fever, chills and diaphoresis.  Respiratory: Negative for cough and shortness of breath.   Cardiovascular: Negative for chest pain and palpitations.  Gastrointestinal: Negative for vomiting, abdominal  pain, diarrhea and constipation.  Genitourinary: Negative for dysuria, urgency and frequency.  Musculoskeletal: Negative for myalgias and arthralgias.  Skin: Positive for wound. Negative for rash.  Neurological: Negative for headaches.    Allergies  Review of patient's allergies indicates no known allergies.  Home Medications   Current Outpatient Rx  Name Route Sig Dispense Refill  . CYCLOBENZAPRINE HCL 10 MG PO TABS Oral Take 1 tablet (10 mg total) by mouth at bedtime as needed. 30 tablet 0  . HYDROCODONE-ACETAMINOPHEN 5-325 MG PO TABS Oral Take 2 tablets by mouth every 4 (four) hours as needed for pain. 10 tablet 0  . NAPROXEN SODIUM 220 MG PO TABS Oral Take 220 mg by mouth as needed.      BP 149/85  Pulse 85  Temp 98 F (36.7 C) (Oral)  Resp 18  Ht 5\' 10"  (1.778 m)  Wt 217 lb (98.431 kg)  BMI 31.14 kg/m2  SpO2 99%  Physical Exam  Nursing note and vitals reviewed. Constitutional: He appears well-developed and well-nourished. No distress.  HENT:  Head: Normocephalic and atraumatic.  Eyes: Conjunctivae normal are normal. No scleral icterus.  Neck: Normal range of motion. Neck supple.  Cardiovascular: Normal rate, regular rhythm and normal heart sounds.   Pulmonary/Chest: Effort normal and breath sounds normal. No respiratory distress.  Abdominal: Soft. There is no tenderness.  Musculoskeletal: He exhibits no edema.  Neurological: He is alert.  Skin: Skin is warm and dry. He is not diaphoretic.       Skin exam  4 cm laceration of the left index finger.  There is avulsion of the skin tissue in a triangular pattern.  No involvement of tendon or bone.  Patient is full in range of motion of the PIP and PIP.  Neurovascularly intact.  No foreign bodies present  Psychiatric: His behavior is normal.    ED Course  Procedures (including critical care time)  LACERATION REPAIR Performed by: Arthor Captain Authorized by: Arthor Captain Consent: Verbal consent obtained. Risks  and benefits: risks, benefits and alternatives were discussed Consent given by: patient Patient identity confirmed: provided demographic data Prepped and Draped in normal sterile fashion Wound explored  Laceration Location: left index finger  Laceration Length: 4cm  No Foreign Bodies seen or palpated  Anesthesia: local infiltration  Local anesthetic: lidocaine 2% without epinephrine  Anesthetic total:4 ml  Irrigation method: syringe Amount of cleaning: standard  Skin closure: 4.0 ethilon  Number of sutures: 8  Technique: so  Patient tolerance: Patient tolerated the procedure well with no immediate complications.   Labs Reviewed - No data to display No results found.   1. Laceration       MDM  Patient tolerated procedure well.  Wound cleaned and dressed.  Patient will be discharged with pain control.  He is instructed to followup for suture removal in 7 days.  Supportive care and laceration care instructions given.  All questions answered fully. Discussed reasons to seek immediate care. Patient expresses understanding and agrees with plan.          Arthor Captain, PA-C 08/13/12 2213

## 2012-08-13 NOTE — ED Provider Notes (Signed)
Medical screening examination/treatment/procedure(s) were performed by non-physician practitioner and as supervising physician I was immediately available for consultation/collaboration.   Athira Janowicz, MD 08/13/12 2340 

## 2012-12-14 ENCOUNTER — Emergency Department (HOSPITAL_BASED_OUTPATIENT_CLINIC_OR_DEPARTMENT_OTHER): Payer: BC Managed Care – PPO

## 2012-12-14 ENCOUNTER — Emergency Department (HOSPITAL_BASED_OUTPATIENT_CLINIC_OR_DEPARTMENT_OTHER)
Admission: EM | Admit: 2012-12-14 | Discharge: 2012-12-14 | Disposition: A | Discharge status: Home / Self Care | Payer: BC Managed Care – PPO | Attending: Emergency Medicine | Admitting: Emergency Medicine

## 2012-12-14 ENCOUNTER — Encounter (HOSPITAL_BASED_OUTPATIENT_CLINIC_OR_DEPARTMENT_OTHER): Payer: Self-pay | Admitting: *Deleted

## 2012-12-14 DIAGNOSIS — Z862 Personal history of diseases of the blood and blood-forming organs and certain disorders involving the immune mechanism: Secondary | ICD-10-CM | POA: Insufficient documentation

## 2012-12-14 DIAGNOSIS — R200 Anesthesia of skin: Secondary | ICD-10-CM

## 2012-12-14 DIAGNOSIS — R209 Unspecified disturbances of skin sensation: Secondary | ICD-10-CM | POA: Insufficient documentation

## 2012-12-14 DIAGNOSIS — Z8781 Personal history of (healed) traumatic fracture: Secondary | ICD-10-CM | POA: Insufficient documentation

## 2012-12-14 DIAGNOSIS — M25519 Pain in unspecified shoulder: Secondary | ICD-10-CM | POA: Insufficient documentation

## 2012-12-14 DIAGNOSIS — Z8639 Personal history of other endocrine, nutritional and metabolic disease: Secondary | ICD-10-CM | POA: Insufficient documentation

## 2012-12-14 DIAGNOSIS — R51 Headache: Secondary | ICD-10-CM

## 2012-12-14 DIAGNOSIS — F17200 Nicotine dependence, unspecified, uncomplicated: Secondary | ICD-10-CM | POA: Insufficient documentation

## 2012-12-14 HISTORY — DX: Pure hypercholesterolemia, unspecified: E78.00

## 2012-12-14 HISTORY — DX: Fracture of neck, unspecified, initial encounter: S12.9XXA

## 2012-12-14 LAB — RAPID URINE DRUG SCREEN, HOSP PERFORMED
Amphetamines: NOT DETECTED
Barbiturates: NOT DETECTED
Opiates: NOT DETECTED
Tetrahydrocannabinol: NOT DETECTED

## 2012-12-14 LAB — COMPREHENSIVE METABOLIC PANEL
ALT: 15 U/L (ref 0–53)
AST: 17 U/L (ref 0–37)
Albumin: 4.1 g/dL (ref 3.5–5.2)
Alkaline Phosphatase: 74 U/L (ref 39–117)
BUN: 13 mg/dL (ref 6–23)
Chloride: 103 mEq/L (ref 96–112)
Potassium: 4 mEq/L (ref 3.5–5.1)
Sodium: 139 mEq/L (ref 135–145)
Total Bilirubin: 0.4 mg/dL (ref 0.3–1.2)
Total Protein: 7.3 g/dL (ref 6.0–8.3)

## 2012-12-14 LAB — DIFFERENTIAL
Basophils Absolute: 0 10*3/uL (ref 0.0–0.1)
Basophils Relative: 0 % (ref 0–1)
Eosinophils Absolute: 0.2 10*3/uL (ref 0.0–0.7)
Neutro Abs: 4.1 10*3/uL (ref 1.7–7.7)
Neutrophils Relative %: 49 % (ref 43–77)

## 2012-12-14 LAB — URINALYSIS, ROUTINE W REFLEX MICROSCOPIC
Bilirubin Urine: NEGATIVE
Hgb urine dipstick: NEGATIVE
Nitrite: NEGATIVE
Protein, ur: NEGATIVE mg/dL
Urobilinogen, UA: 0.2 mg/dL (ref 0.0–1.0)

## 2012-12-14 LAB — CBC
MCH: 31.2 pg (ref 26.0–34.0)
MCHC: 34.8 g/dL (ref 30.0–36.0)
Platelets: 187 10*3/uL (ref 150–400)
RDW: 13.1 % (ref 11.5–15.5)

## 2012-12-14 LAB — PROTIME-INR
INR: 0.95 (ref 0.00–1.49)
Prothrombin Time: 12.6 seconds (ref 11.6–15.2)

## 2012-12-14 LAB — APTT: aPTT: 29 seconds (ref 24–37)

## 2012-12-14 LAB — TROPONIN I: Troponin I: <0.3 ng/mL (ref <0.30)

## 2012-12-14 NOTE — ED Notes (Signed)
EMS reports patient is transported from [facility] for evaluation and rule out cva.  States pt woke up yesterday morning with complete right sided numbness, face, arm, and leg.  States he had an intermittent headache since waking.  States he was seen at his PCP today for piercing right shoulder pain and headache.  EMS did stroke screen which was negative, arm strength and arm drift negative.  No acute symptoms today.  Pt awake, alert and oriented x 3.

## 2012-12-14 NOTE — ED Notes (Signed)
Ignacia Palma MD at bedside.

## 2012-12-14 NOTE — ED Notes (Signed)
MD at bedside discussing plan of care and recommendations with pt

## 2012-12-14 NOTE — ED Notes (Signed)
Pt counseled by EDP Ignacia Palma. Pt decided to go home and f/u in am at [facility]. Caox4, ambulates with steady gait, speech clear at time of d/c. Discussed emergency symptoms with pt and advised to call 911 if any occur. Pt's wife present at bedside. Pt and wife verbalize understanding

## 2012-12-14 NOTE — ED Notes (Signed)
Pt seen at PCP office today and sent here for eval regarding right side numbness- pt reports he woke up around 0930 yesterday with numbness to right side of body and difficulty walking- states sx resolved in about 45 minutes- Reports he also had headache and right shoulder pain which he still has at this time- shoulder pain worse with movement- hx of cervical neck fusion around 2004

## 2012-12-14 NOTE — ED Provider Notes (Signed)
History     CSN: [id number]  Arrival date & time 12/14/12  1631   First MD Initiated Contact with Patient 12/14/12 1820      Chief Complaint  Patient presents with  . Numbness    (Consider location/radiation/quality/duration/timing/severity/associated sxs/prior treatment) Patient is a 47 y.o. male presenting with Acute Neurological Problem. The history is provided by the patient. No language interpreter was used.  Cerebrovascular Accident This is a new problem. The current episode started yesterday (Patient says he woke up yesterday with an occipital headache bilaterally, and hand with numbness in the entire right side of his body. He took some relief of the headache with some relief. Today he is better. He also notes a pain in his right shoulder. ). The problem occurs constantly. The problem has been gradually improving. Associated symptoms include headaches. Pertinent negatives include no chest pain, no abdominal pain and no shortness of breath. Nothing aggravates the symptoms. Nothing relieves the symptoms. Treatments tried: Aleve  The treatment provided mild relief.    Past Medical History  Diagnosis Date  . Cervical vertebral closed fracture   . Elevated cholesterol     Past Surgical History  Procedure Laterality Date  . Cervical fusion      No family history on file.  History  Substance Use Topics  . Smoking status: Current Every Day Smoker  . Smokeless tobacco: Not on file  . Alcohol Use: Yes      Review of Systems  Constitutional: Negative for fever and chills.  Respiratory: Negative for shortness of breath.   Cardiovascular: Negative for chest pain.  Gastrointestinal: Negative for abdominal pain.  Genitourinary: Negative.   Musculoskeletal: Negative.   Skin: Negative.   Neurological: Positive for numbness and headaches.  Psychiatric/Behavioral: Negative.     Allergies  Review of patient's allergies indicates no known allergies.  Home Medications  No  current outpatient prescriptions on file.  BP 118/68  Pulse 78  Temp(Src) 98.1 F (36.7 C) (Oral)  Resp 12  Ht 6' (1.829 m)  Wt 218 lb (98.884 kg)  BMI 29.56 kg/m2  SpO2 100%  Physical Exam  Nursing note and vitals reviewed. Constitutional: He is oriented to person, place, and time. He appears well-developed and well-nourished. No distress.  HENT:  Head: Normocephalic and atraumatic.  Right Ear: External ear normal.  Left Ear: External ear normal.  Mouth/Throat: Oropharynx is clear and moist.  Eyes: Conjunctivae and EOM are normal. Pupils are equal, round, and reactive to light.  Neck: Normal range of motion. Neck supple.  Cardiovascular: Normal rate, regular rhythm and normal heart sounds.   Pulmonary/Chest: Effort normal and breath sounds normal.  Abdominal: Soft. Bowel sounds are normal.  Musculoskeletal: Normal range of motion.  Neurological: He is alert and oriented to person, place, and time.  Qualitative numb feeling in right arm and leg, but can feel touch.  No motor deficit.  DTR's 1+and symmetrical.  Skin: Skin is warm and dry.  Psychiatric: He has a normal mood and affect. His behavior is normal.    ED Course  Procedures (including critical care time)  Labs Reviewed  ETHANOL  PROTIME-INR  APTT  CBC  DIFFERENTIAL  COMPREHENSIVE METABOLIC PANEL  TROPONIN I  URINE RAPID DRUG SCREEN (HOSP PERFORMED)  URINALYSIS, ROUTINE W REFLEX MICROSCOPIC   Ct Head Wo Contrast  12/14/2012  *RADIOLOGY REPORT*  Clinical Data: 47 year old male awoke yesterday morning with right side numbness.  Intermittent headache.  CT HEAD WITHOUT CONTRAST  Technique:  Contiguous axial  images were obtained from the base of the skull through the vertex without contrast.  Comparison: None.  Findings: Left mastoids and tympanic cavity are opacified.  Other Visualized paranasal sinuses and mastoids are clear.  No acute osseous abnormality identified.  No acute orbit or scalp soft tissue abnormality.   Normal cerebral volume.  No ventriculomegaly. Gray-white matter differentiation is within normal limits throughout the brain.  No evidence of cortically based acute infarction identified.  No midline shift, mass effect, or evidence of mass lesion.  No acute intracranial hemorrhage identified.  Mild calcified atherosclerosis at the skull base.  No suspicious intracranial vascular hyperdensity.  IMPRESSION: 1. Normal noncontrast CT appearance of the brain. 2.  Opacified left tympanic cavity and mastoids compatible with inflammatory process such as otitis media.   Original Report Authenticated By: Erskine Speed, M.D.    6:58 PM Pt seen --> physical exam performed.  He failed swallow screening.  CT of head was negative for signs of acute stroke.  I called Dr. Amada Jupiter, neurologist at [facility], who advised that pt's symptoms could be from a stroke or from a vertebral artery dissection.  He advised hospitalization for further workup, specificallyl, MR of the brain and MRA of the neck.     Date: 12/14/2012  Rate:71  Rhythm: normal sinus rhythm  QRS Axis: normal  Intervals: normal QRS:  Low voltage in frontal leads.  ST/T Wave abnormalities: normal  Conduction Disutrbances:none  Narrative Interpretation: Borderline EKG  Old EKG Reviewed: none available  7:02 PM Pt and his wife discussed the advice to be admitted for stroke workup.  He decided that he would go home, and go to [facility] tomorrow to have his workup completed.  I warned him that there was a risk to this decision, that he could suffer a paralytic stroke.  Advised him to go to [facility] ED tomorrow to have MR of the brain and MRA of the neck to further his stroke workup.      1. Headache   2. Numbness          Carleene Cooper III, MD 12/15/12 [phone]

## 2012-12-14 NOTE — ED Notes (Signed)
Patient transported to CT 

## 2012-12-15 ENCOUNTER — Observation Stay (HOSPITAL_COMMUNITY)
Admission: EM | Admit: 2012-12-15 | Discharge: 2012-12-16 | Disposition: A | Discharge status: Home / Self Care | Payer: BC Managed Care – PPO | Attending: Internal Medicine | Admitting: Internal Medicine

## 2012-12-15 ENCOUNTER — Encounter (HOSPITAL_BASED_OUTPATIENT_CLINIC_OR_DEPARTMENT_OTHER): Payer: Self-pay | Admitting: *Deleted

## 2012-12-15 ENCOUNTER — Emergency Department (HOSPITAL_COMMUNITY): Payer: BC Managed Care – PPO

## 2012-12-15 ENCOUNTER — Encounter (HOSPITAL_COMMUNITY): Payer: Self-pay | Admitting: *Deleted

## 2012-12-15 DIAGNOSIS — R200 Anesthesia of skin: Secondary | ICD-10-CM | POA: Diagnosis present

## 2012-12-15 DIAGNOSIS — Z8781 Personal history of (healed) traumatic fracture: Secondary | ICD-10-CM

## 2012-12-15 DIAGNOSIS — G4730 Sleep apnea, unspecified: Secondary | ICD-10-CM

## 2012-12-15 DIAGNOSIS — J189 Pneumonia, unspecified organism: Secondary | ICD-10-CM

## 2012-12-15 DIAGNOSIS — N200 Calculus of kidney: Secondary | ICD-10-CM

## 2012-12-15 DIAGNOSIS — R209 Unspecified disturbances of skin sensation: Secondary | ICD-10-CM | POA: Insufficient documentation

## 2012-12-15 DIAGNOSIS — R1310 Dysphagia, unspecified: Secondary | ICD-10-CM | POA: Insufficient documentation

## 2012-12-15 DIAGNOSIS — G459 Transient cerebral ischemic attack, unspecified: Principal | ICD-10-CM | POA: Insufficient documentation

## 2012-12-15 DIAGNOSIS — N508 Other specified disorders of male genital organs: Secondary | ICD-10-CM

## 2012-12-15 DIAGNOSIS — Z720 Tobacco use: Secondary | ICD-10-CM

## 2012-12-15 DIAGNOSIS — E785 Hyperlipidemia, unspecified: Secondary | ICD-10-CM | POA: Insufficient documentation

## 2012-12-15 DIAGNOSIS — F17200 Nicotine dependence, unspecified, uncomplicated: Secondary | ICD-10-CM | POA: Insufficient documentation

## 2012-12-15 DIAGNOSIS — H9190 Unspecified hearing loss, unspecified ear: Secondary | ICD-10-CM

## 2012-12-15 LAB — CBC
HCT: 45.8 % (ref 39.0–52.0)
Hemoglobin: 16.4 g/dL (ref 13.0–17.0)
MCH: 32.1 pg (ref 26.0–34.0)
MCHC: 35.8 g/dL (ref 30.0–36.0)
MCV: 89.6 fL (ref 78.0–100.0)
Platelets: 183 10*3/uL (ref 150–400)
RBC: 5.11 MIL/uL (ref 4.22–5.81)
RDW: 13.2 % (ref 11.5–15.5)
WBC: 8.5 10*3/uL (ref 4.0–10.5)

## 2012-12-15 LAB — RAPID URINE DRUG SCREEN, HOSP PERFORMED
Amphetamines: NOT DETECTED
Barbiturates: NOT DETECTED
Benzodiazepines: NOT DETECTED
Cocaine: NOT DETECTED
Opiates: NOT DETECTED
Tetrahydrocannabinol: NOT DETECTED

## 2012-12-15 LAB — URINALYSIS, ROUTINE W REFLEX MICROSCOPIC
Bilirubin Urine: NEGATIVE
Glucose, UA: NEGATIVE mg/dL
Hgb urine dipstick: NEGATIVE
Ketones, ur: NEGATIVE mg/dL
Leukocytes, UA: NEGATIVE
Nitrite: NEGATIVE
Protein, ur: NEGATIVE mg/dL
Specific Gravity, Urine: 1.003 — ABNORMAL LOW (ref 1.005–1.030)
Urobilinogen, UA: 0.2 mg/dL (ref 0.0–1.0)
pH: 6.5 (ref 5.0–8.0)

## 2012-12-15 LAB — COMPREHENSIVE METABOLIC PANEL
ALT: 14 U/L (ref 0–53)
AST: 16 U/L (ref 0–37)
Albumin: 4 g/dL (ref 3.5–5.2)
Alkaline Phosphatase: 74 U/L (ref 39–117)
BUN: 11 mg/dL (ref 6–23)
CO2: 23 mEq/L (ref 19–32)
Calcium: 9.5 mg/dL (ref 8.4–10.5)
Chloride: 102 mEq/L (ref 96–112)
Creatinine, Ser: 0.8 mg/dL (ref 0.50–1.35)
GFR calc Af Amer: >90 mL/min (ref >90)
GFR calc non Af Amer: >90 mL/min (ref >90)
Glucose, Bld: 89 mg/dL (ref 70–99)
Potassium: 3.9 mEq/L (ref 3.5–5.1)
Sodium: 138 mEq/L (ref 135–145)
Total Bilirubin: 0.3 mg/dL (ref 0.3–1.2)
Total Protein: 7.2 g/dL (ref 6.0–8.3)

## 2012-12-15 LAB — DIFFERENTIAL
Basophils Absolute: 0 10*3/uL (ref 0.0–0.1)
Basophils Relative: 0 % (ref 0–1)
Eosinophils Absolute: 0.2 10*3/uL (ref 0.0–0.7)
Eosinophils Relative: 3 % (ref 0–5)
Lymphocytes Relative: 30 % (ref 12–46)
Lymphs Abs: 2.6 10*3/uL (ref 0.7–4.0)
Monocytes Absolute: 0.7 10*3/uL (ref 0.1–1.0)
Monocytes Relative: 8 % (ref 3–12)
Neutro Abs: 5 10*3/uL (ref 1.7–7.7)
Neutrophils Relative %: 58 % (ref 43–77)

## 2012-12-15 LAB — PROTIME-INR
INR: 0.93 (ref 0.00–1.49)
Prothrombin Time: 12.4 seconds (ref 11.6–15.2)

## 2012-12-15 LAB — APTT: aPTT: 29 seconds (ref 24–37)

## 2012-12-15 LAB — POCT I-STAT TROPONIN I: Troponin i, poc: 0 ng/mL (ref 0.00–0.08)

## 2012-12-15 LAB — TROPONIN I: Troponin I: <0.3 ng/mL (ref <0.30)

## 2012-12-15 MED ORDER — ASPIRIN 300 MG RE SUPP
300.0000 mg | Freq: Every day | RECTAL | Status: DC
  Filled 2012-12-15: qty 1

## 2012-12-15 MED ORDER — ASPIRIN 81 MG PO CHEW
324.0000 mg | CHEWABLE_TABLET | Freq: Once | ORAL | Status: AC
  Administered 2012-12-15: 324 mg via ORAL
  Filled 2012-12-15: qty 4

## 2012-12-15 MED ORDER — NICOTINE 21 MG/24HR TD PT24
21.0000 mg | MEDICATED_PATCH | Freq: Every day | TRANSDERMAL | Status: DC
  Administered 2012-12-16: 21 mg via TRANSDERMAL
  Filled 2012-12-15: qty 1

## 2012-12-15 MED ORDER — GADOBENATE DIMEGLUMINE 529 MG/ML IV SOLN
20.0000 mL | Freq: Once | INTRAVENOUS | Status: AC
  Administered 2012-12-15: 20 mL via INTRAVENOUS

## 2012-12-15 MED ORDER — ASPIRIN 325 MG PO TABS
325.0000 mg | ORAL_TABLET | Freq: Every day | ORAL | Status: DC
  Administered 2012-12-16: 325 mg via ORAL
  Filled 2012-12-15: qty 1

## 2012-12-15 NOTE — H&P (Signed)
PCP:   Danise Edge, MD   Chief Complaint:  Rt sided numbness  HPI: 47 yo male started having rt sided numbness 2 days ago went to urgent care ct head was done negative and advised for admission but pt left AMA.  Pt denies this ever happened.  Comes to ED today to get his MRI done which urgent care recommended yesterday.  MRI/A done today normal.  Pt has had almost complete resolution of his symtpoms except some right facial numbness which is mild.  He did not ever have any weakness in any extremeties but did have some difficulty with swallowing which is back to normal now.  No fevers/ no recent illnesses.  Smoker.  As i'm seeing pt he is taking off his ekg leads and pulse ox monitor and is leaving AMA again.  He is very upset because he has been here since 1p and he had to wait 3 hours to get his MRI done and now the ED doctor wants him to be admitted for full w/u.  Instructed him of the risk he was taking by leaving AMA including suffering from major debilitating stroke he understands.  About 30 minutes later after pt talks to his wife, he has decided to stay overnight.    Review of Systems:  Positive and negative as per HPI otherwise all other systems are negative  Past Medical History: Past Medical History  Diagnosis Date  . Chicken pox as a child  . Broken jaw 1983  . Kidney stones 1990  . Tobacco abuse 05/19/2012  . History of cervical fracture   . Hyperlipidemia   . Cervical vertebral closed fracture   . Elevated cholesterol    Past Surgical History  Procedure Laterality Date  . Fusion in neck  2004  . Fatty tissue removed  2007    left abdominal wall, lipoma  . Kidney stones removed  1990  . Cervical fusion      Medications: Prior to Admission medications   Medication Sig Start Date End Date Taking? Authorizing Provider  naproxen sodium (ANAPROX) 220 MG tablet Take 220 mg by mouth as needed.   Yes Historical Provider, MD    Allergies:  No Known Allergies  Social  History:  reports that he has been smoking Cigarettes.  He has a 30 pack-year smoking history. He does not have any smokeless tobacco history on file. He reports that  drinks alcohol. He reports that he does not use illicit drugs.  Family History: Family History  Problem Relation Age of Onset  . Seizures Mother   . ADD / ADHD Daughter   . Mental illness Daughter     bipolar, add  . Heart disease Sister     Physical Exam: Filed Vitals:   12/15/12 1341 12/15/12 1957 12/15/12 2015  BP: 117/66  123/86  Pulse: 90  71  Temp: 97.8 F (36.6 C) 98.5 F (36.9 C)   TempSrc: Oral    Resp: 18  20  SpO2: 98%  99%   General appearance: alert, cooperative and no distress Head: Normocephalic, without obvious abnormality, atraumatic Eyes: negative Nose: Nares normal. Septum midline. Mucosa normal. No drainage or sinus tenderness. Neck: no JVD and supple, symmetrical, trachea midline Lungs: clear to auscultation bilaterally Heart: regular rate and rhythm, S1, S2 normal, no murmur, click, rub or gallop Abdomen: soft, non-tender; bowel sounds normal; no masses,  no organomegaly Extremities: extremities normal, atraumatic, no cyanosis or edema Pulses: 2+ and symmetric Skin: Skin color, texture, turgor normal.  No rashes or lesions Neurologic: Alert and oriented X 3, normal strength and tone. Normal symmetric reflexes. Normal coordination and gait Mental status: Alert, oriented, thought content appropriate Cranial nerves: normal Motor: grossly normal    Labs on Admission:   Recent Labs  12/14/12 1825 12/15/12 1721  NA 139 138  K 4.0 3.9  CL 103 102  CO2 23 23  GLUCOSE 99 89  BUN 13 11  CREATININE 0.90 0.80  CALCIUM 9.6 9.5    Recent Labs  12/14/12 1825 12/15/12 1721  AST 17 16  ALT 15 14  ALKPHOS 74 74  BILITOT 0.4 0.3  PROT 7.3 7.2  ALBUMIN 4.1 4.0    Recent Labs  12/14/12 1825 12/15/12 1721  WBC 8.3 8.5  NEUTROABS 4.1 5.0  HGB 16.3 16.4  HCT 46.9 45.8  MCV  89.7 89.6  PLT 187 183    Recent Labs  12/14/12 1825 12/15/12 1721  TROPONINI <0.30 <0.30   Radiological Exams on Admission: Dg Chest 2 View  12/15/2012  *RADIOLOGY REPORT*  Clinical Data: Right-sided numbness and weakness.  CHEST - 2 VIEW  Comparison: None  Findings: The cardiomediastinal silhouette is unremarkable. The lungs are clear. There is no evidence of focal airspace disease, pulmonary edema, suspicious pulmonary nodule/mass, pleural effusion, or pneumothorax. No acute bony abnormalities are identified.  IMPRESSION: No evidence of active cardiopulmonary disease.   Original Report Authenticated By: Harmon Pier, M.D.    Dg Chest 2 View  12/14/2012  *RADIOLOGY REPORT*  Clinical Data: Headache and right-sided numbness.  Smoker.  CHEST - 2 VIEW  Comparison:  None.  Findings:  The heart size and mediastinal contours are within normal limits.  Both lungs are clear.  The visualized skeletal structures are unremarkable.  IMPRESSION: No active cardiopulmonary disease.   Original Report Authenticated By: Myles Rosenthal, M.D.    Ct Head Wo Contrast  12/14/2012  *RADIOLOGY REPORT*  Clinical Data: 47 year old male awoke yesterday morning with right side numbness.  Intermittent headache.  CT HEAD WITHOUT CONTRAST  Technique:  Contiguous axial images were obtained from the base of the skull through the vertex without contrast.  Comparison: None.  Findings: Left mastoids and tympanic cavity are opacified.  Other Visualized paranasal sinuses and mastoids are clear.  No acute osseous abnormality identified.  No acute orbit or scalp soft tissue abnormality.  Normal cerebral volume.  No ventriculomegaly. Gray-white matter differentiation is within normal limits throughout the brain.  No evidence of cortically based acute infarction identified.  No midline shift, mass effect, or evidence of mass lesion.  No acute intracranial hemorrhage identified.  Mild calcified atherosclerosis at the skull base.  No suspicious  intracranial vascular hyperdensity.  IMPRESSION: 1. Normal noncontrast CT appearance of the brain. 2.  Opacified left tympanic cavity and mastoids compatible with inflammatory process such as otitis media.   Original Report Authenticated By: Erskine Speed, M.D.    Mr [facility] Wo Contrast  12/15/2012  *RADIOLOGY REPORT*  Clinical Data:  Weakness.  Right-sided numbness.  Parasthesias in the face.  Headache beginning the posterior neck and extending to the top of the head.  Evaluate for dissection.  MRI HEAD WITHOUT AND WITH CONTRAST MRA HEAD WITHOUT CONTRAST MRA NECK WITHOUT AND WITH CONTRAST  Technique:  Multiplanar, multiecho pulse sequences of the brain and surrounding structures were obtained without and with intravenous contrast.  Angiographic images of the Circle of Willis were obtained using MRA technique without intravenous contrast. Angiographic images of the neck were obtained  using MRA technique without and with intravenous contrast.  Carotid stenosis measurements (when applicable) are obtained utilizing NASCET criteria, using the distal internal carotid diameter as the denominator.  Contrast: 20mL MULTIHANCE GADOBENATE DIMEGLUMINE 529 MG/ML IV SOLN  Comparison:   None.  MRI HEAD  Findings:  The diffusion weighted images demonstrate no evidence for acute or subacute infarction.  No hemorrhage or mass lesion is present.  There is no significant white matter disease.  Flow is present in the major intracranial arteries.  The globes and orbits are intact.  The paranasal sinuses are clear.  The left middle ear and mastoid effusion is present.  No obstructing nasopharyngeal lesion is present.  There is minimal fluid in the right mastoid air cells.  The postcontrast images demonstrate no pathologic enhancement.  IMPRESSION:  1.  Normal MRI appearance the brain. 2.  Left middle ear mastoid effusion.  Minimal fluid in the right mastoid air cells.  No obstructing nasopharyngeal lesions are evident.  MRA HEAD   Findings: The internal carotid arteries are within normal limits from high cervical segments to the ICA termini.  The A1 and M1 segments are normal.  The anterior communicating artery is patent. There is mild segmental irregularity of MCA branch vessels bilaterally.  No significant proximal stenosis or occlusion is present.  There are no significant aneurysms.  The left vertebral artery is the dominant vessel.  The basilar artery is small.  There is mild narrowing of proximal left P1 segment.  There is asymmetric attenuation of right PCA branch vessels.  IMPRESSION:  1.  Small vessel disease with segmental attenuation as described. 2.  More focal disease in the right PCA territory with loss of distal branch vessels.  MRA NECK  Findings: The time-of-flight images demonstrate no significant flow disturbance at either carotid bifurcation.  Flow is antegrade in the vertebral arteries.  A standard three-vessel arch configuration is present.  Signal loss at the origin of the vertebral arteries bilaterally is likely artifactual.  The remainder of the vertebral arteries are normal. The left vertebral artery is slightly dominant to the right.  The PICA origins are visualized and normal bilaterally.  Right PCA branch vessels are better visualized on this study.  The right common carotid arteries are within normal limits.  The bifurcation is unremarkable.  The right internal carotid artery is normal.  The left common carotid artery is within normal limits.  The bifurcation is unremarkable.  The left internal carotid artery is normal.  IMPRESSION: Negative MRA of the neck.   Original Report Authenticated By: Marin Roberts, M.D.    Mr Angiogram Neck W Wo Contrast  12/15/2012  *RADIOLOGY REPORT*  Clinical Data:  Weakness.  Right-sided numbness.  Parasthesias in the face.  Headache beginning the posterior neck and extending to the top of the head.  Evaluate for dissection.  MRI HEAD WITHOUT AND WITH CONTRAST MRA HEAD  WITHOUT CONTRAST MRA NECK WITHOUT AND WITH CONTRAST  Technique:  Multiplanar, multiecho pulse sequences of the brain and surrounding structures were obtained without and with intravenous contrast.  Angiographic images of the Circle of Willis were obtained using MRA technique without intravenous contrast. Angiographic images of the neck were obtained using MRA technique without and with intravenous contrast.  Carotid stenosis measurements (when applicable) are obtained utilizing NASCET criteria, using the distal internal carotid diameter as the denominator.  Contrast: 20mL MULTIHANCE GADOBENATE DIMEGLUMINE 529 MG/ML IV SOLN  Comparison:   None.  MRI HEAD  Findings:  The diffusion weighted  images demonstrate no evidence for acute or subacute infarction.  No hemorrhage or mass lesion is present.  There is no significant white matter disease.  Flow is present in the major intracranial arteries.  The globes and orbits are intact.  The paranasal sinuses are clear.  The left middle ear and mastoid effusion is present.  No obstructing nasopharyngeal lesion is present.  There is minimal fluid in the right mastoid air cells.  The postcontrast images demonstrate no pathologic enhancement.  IMPRESSION:  1.  Normal MRI appearance the brain. 2.  Left middle ear mastoid effusion.  Minimal fluid in the right mastoid air cells.  No obstructing nasopharyngeal lesions are evident.  MRA HEAD  Findings: The internal carotid arteries are within normal limits from high cervical segments to the ICA termini.  The A1 and M1 segments are normal.  The anterior communicating artery is patent. There is mild segmental irregularity of MCA branch vessels bilaterally.  No significant proximal stenosis or occlusion is present.  There are no significant aneurysms.  The left vertebral artery is the dominant vessel.  The basilar artery is small.  There is mild narrowing of proximal left P1 segment.  There is asymmetric attenuation of right PCA branch  vessels.  IMPRESSION:  1.  Small vessel disease with segmental attenuation as described. 2.  More focal disease in the right PCA territory with loss of distal branch vessels.  MRA NECK  Findings: The time-of-flight images demonstrate no significant flow disturbance at either carotid bifurcation.  Flow is antegrade in the vertebral arteries.  A standard three-vessel arch configuration is present.  Signal loss at the origin of the vertebral arteries bilaterally is likely artifactual.  The remainder of the vertebral arteries are normal. The left vertebral artery is slightly dominant to the right.  The PICA origins are visualized and normal bilaterally.  Right PCA branch vessels are better visualized on this study.  The right common carotid arteries are within normal limits.  The bifurcation is unremarkable.  The right internal carotid artery is normal.  The left common carotid artery is within normal limits.  The bifurcation is unremarkable.  The left internal carotid artery is normal.  IMPRESSION: Negative MRA of the neck.   Original Report Authenticated By: Marin Roberts, M.D.    Mr [facility] Contrast  12/15/2012  *RADIOLOGY REPORT*  Clinical Data:  Weakness.  Right-sided numbness.  Parasthesias in the face.  Headache beginning the posterior neck and extending to the top of the head.  Evaluate for dissection.  MRI HEAD WITHOUT AND WITH CONTRAST MRA HEAD WITHOUT CONTRAST MRA NECK WITHOUT AND WITH CONTRAST  Technique:  Multiplanar, multiecho pulse sequences of the brain and surrounding structures were obtained without and with intravenous contrast.  Angiographic images of the Circle of Willis were obtained using MRA technique without intravenous contrast. Angiographic images of the neck were obtained using MRA technique without and with intravenous contrast.  Carotid stenosis measurements (when applicable) are obtained utilizing NASCET criteria, using the distal internal carotid diameter as the denominator.   Contrast: 20mL MULTIHANCE GADOBENATE DIMEGLUMINE 529 MG/ML IV SOLN  Comparison:   None.  MRI HEAD  Findings:  The diffusion weighted images demonstrate no evidence for acute or subacute infarction.  No hemorrhage or mass lesion is present.  There is no significant white matter disease.  Flow is present in the major intracranial arteries.  The globes and orbits are intact.  The paranasal sinuses are clear.  The left middle ear and  mastoid effusion is present.  No obstructing nasopharyngeal lesion is present.  There is minimal fluid in the right mastoid air cells.  The postcontrast images demonstrate no pathologic enhancement.  IMPRESSION:  1.  Normal MRI appearance the brain. 2.  Left middle ear mastoid effusion.  Minimal fluid in the right mastoid air cells.  No obstructing nasopharyngeal lesions are evident.  MRA HEAD  Findings: The internal carotid arteries are within normal limits from high cervical segments to the ICA termini.  The A1 and M1 segments are normal.  The anterior communicating artery is patent. There is mild segmental irregularity of MCA branch vessels bilaterally.  No significant proximal stenosis or occlusion is present.  There are no significant aneurysms.  The left vertebral artery is the dominant vessel.  The basilar artery is small.  There is mild narrowing of proximal left P1 segment.  There is asymmetric attenuation of right PCA branch vessels.  IMPRESSION:  1.  Small vessel disease with segmental attenuation as described. 2.  More focal disease in the right PCA territory with loss of distal branch vessels.  MRA NECK  Findings: The time-of-flight images demonstrate no significant flow disturbance at either carotid bifurcation.  Flow is antegrade in the vertebral arteries.  A standard three-vessel arch configuration is present.  Signal loss at the origin of the vertebral arteries bilaterally is likely artifactual.  The remainder of the vertebral arteries are normal. The left vertebral artery  is slightly dominant to the right.  The PICA origins are visualized and normal bilaterally.  Right PCA branch vessels are better visualized on this study.  The right common carotid arteries are within normal limits.  The bifurcation is unremarkable.  The right internal carotid artery is normal.  The left common carotid artery is within normal limits.  The bifurcation is unremarkable.  The left internal carotid artery is normal.  IMPRESSION: Negative MRA of the neck.   Original Report Authenticated By: Marin Roberts, M.D.     Assessment/Plan 47 yo male with possible tia  Principal Problem:   TIA (transient ischemic attack) Active Problems:   Tobacco abuse   Hyperlipidemia   Numbness of extremity  obs to complete w/u.  Asa.  Give food.  Nicotine patch.  cva pathway.  Nobie Alleyne A 12/15/2012, 8:59 PM

## 2012-12-15 NOTE — ED Provider Notes (Signed)
History     CSN: [id number]  Arrival date & time 12/15/12  1335   First MD Initiated Contact with Patient 12/15/12 1701      Chief Complaint  Patient presents with  . R/O stroke      HPI Pt was seen at 1735.   Per pt, c/o sudden onset and resolution of one episode of right sided "numbness" that occurred yesterday.  Pt states he woke up yesterday morning with his right lower face, RUE and RLE "numb."  States this lasted approx 1-2 hours before self resolving.  Pt states he also noticed that he "was choking on my food" yesterday. Feels symptoms continue resolved today. Pt was eval at [facility] ED yesterday for same, and was recommended to be admitted by EDP after consultation with Neuro MD, but pt refused stating he "had things to do."  Pt returns today for TIA/CVA workup and admission.  Denies focal motor weakness, no facial droop, no slurred speech, no visual changes, no CP/palpitation, no abd pain, no N/V/D, no fevers, no neck pain, no injury.    Past Medical History  Diagnosis Date  . Chicken pox as a child  . Broken jaw 1983  . Kidney stones 1990  . Tobacco abuse 05/19/2012  . History of cervical fracture   . Hyperlipidemia   . Cervical vertebral closed fracture   . Elevated cholesterol     Past Surgical History  Procedure Laterality Date  . Fusion in neck  2004  . Fatty tissue removed  2007    left abdominal wall, lipoma  . Kidney stones removed  1990  . Cervical fusion      Family History  Problem Relation Age of Onset  . Seizures Mother   . ADD / ADHD Daughter   . Mental illness Daughter     bipolar, add  . Heart disease Sister     History  Substance Use Topics  . Smoking status: Current Every Day Smoker -- 1.00 packs/day for 30 years    Types: Cigarettes  . Smokeless tobacco: Not on file  . Alcohol Use: Yes    Review of Systems ROS: Statement: All systems negative except as marked or noted in the HPI; Constitutional: Negative for fever and chills. ; ; Eyes:  Negative for eye pain, redness and discharge. ; ; ENMT: Negative for ear pain, hoarseness, nasal congestion, sinus pressure and sore throat. ; ; Cardiovascular: Negative for chest pain, palpitations, diaphoresis, dyspnea and peripheral edema. ; ; Respiratory: Negative for cough, wheezing and stridor. ; ; Gastrointestinal: Negative for nausea, vomiting, diarrhea, abdominal pain, blood in stool, hematemesis, jaundice and rectal bleeding. . ; ; Genitourinary: Negative for dysuria, flank pain and hematuria. ; ; Musculoskeletal: Negative for back pain and neck pain. Negative for swelling and trauma.; ; Skin: Negative for pruritus, rash, abrasions, blisters, bruising and skin lesion.; ; Neuro: +numbness, dysphagia. Negative for headache, lightheadedness and neck stiffness. Negative for weakness, altered level of consciousness , altered mental status, extremity weakness, involuntary movement, seizure and syncope.     Allergies  Review of patient's allergies indicates no known allergies.  Home Medications   Current Outpatient Rx  Name  Route  Sig  Dispense  Refill  . naproxen sodium (ANAPROX) 220 MG tablet   Oral   Take 220 mg by mouth as needed.           BP 117/66  Pulse 90  Temp(Src) 97.8 F (36.6 C) (Oral)  Resp 18  SpO2 98%  Physical Exam 1740: Physical examination:  Nursing notes reviewed; Vital signs and O2 SAT reviewed;  Constitutional: Well developed, Well nourished, Well hydrated, In no acute distress; Head:  Normocephalic, atraumatic; Eyes: EOMI, PERRL, No scleral icterus; ENMT: Mouth and pharynx normal, Mucous membranes moist; Neck: Supple, Full range of motion, No lymphadenopathy; Cardiovascular: Regular rate and rhythm, No gallop; Respiratory: Breath sounds clear & equal bilaterally, No rales, rhonchi, wheezes.  Speaking full sentences with ease, Normal respiratory effort/excursion; Chest: Nontender, Movement normal; Abdomen: Soft, Nontender, Nondistended, Normal bowel sounds;  Genitourinary: No CVA tenderness; Extremities: Pulses normal, No tenderness, No edema, No calf edema or asymmetry.; Neuro: AA&Ox3, Major CN grossly intact.  Strength 5/5 equal bilat UE's and LE's.  DTR 2/4 equal bilat UE's and LE's.  No gross sensory deficits.  Normal cerebellar testing bilat UE's (finger-nose) and LE's (heel-shin).  Speech clear.  No facial droop.  No nystagmus.; Skin: Color normal, Warm, Dry.   ED Course  Procedures    MDM  MDM Reviewed: previous chart, nursing note and vitals Reviewed previous: labs, ECG, x-ray and CT scan Interpretation: labs, ECG, x-ray and MRI    Date: 12/15/2012  Rate: 68  Rhythm: normal sinus rhythm  QRS Axis: normal  Intervals: normal  ST/T Wave abnormalities: normal  Conduction Disutrbances:none  Narrative Interpretation:   Old EKG Reviewed: unchanged; no significant changes from previous EKG dated 12/14/2012.   Results for orders placed during the hospital encounter of 12/15/12  [facility]      Result Value Range   Prothrombin Time 12.4  11.6 - 15.2 seconds   INR 0.93  0.00 - 1.49  APTT      Result Value Range   aPTT 29  24 - 37 seconds  CBC      Result Value Range   WBC 8.5  4.0 - 10.5 K/uL   RBC 5.11  4.22 - 5.81 MIL/uL   Hemoglobin 16.4  13.0 - 17.0 g/dL   HCT 40.9  81.1 - 91.4 %   MCV 89.6  78.0 - 100.0 fL   MCH 32.1  26.0 - 34.0 pg   MCHC 35.8  30.0 - 36.0 g/dL   RDW 78.2  95.6 - 21.3 %   Platelets 183  150 - 400 K/uL  DIFFERENTIAL      Result Value Range   Neutrophils Relative 58  43 - 77 %   Neutro Abs 5.0  1.7 - 7.7 K/uL   Lymphocytes Relative 30  12 - 46 %   Lymphs Abs 2.6  0.7 - 4.0 K/uL   Monocytes Relative 8  3 - 12 %   Monocytes Absolute 0.7  0.1 - 1.0 K/uL   Eosinophils Relative 3  0 - 5 %   Eosinophils Absolute 0.2  0.0 - 0.7 K/uL   Basophils Relative 0  0 - 1 %   Basophils Absolute 0.0  0.0 - 0.1 K/uL  COMPREHENSIVE METABOLIC PANEL      Result Value Range   Sodium 138  135 - 145 mEq/L   Potassium  3.9  3.5 - 5.1 mEq/L   Chloride 102  96 - 112 mEq/L   CO2 23  19 - 32 mEq/L   Glucose, Bld 89  70 - 99 mg/dL   BUN 11  6 - 23 mg/dL   Creatinine, Ser 0.86  0.50 - 1.35 mg/dL   Calcium 9.5  8.4 - 57.8 mg/dL   Total Protein 7.2  6.0 - 8.3 g/dL   Albumin 4.0  3.5 - 5.2 g/dL   AST 16  0 - 37 U/L   ALT 14  0 - 53 U/L   Alkaline Phosphatase 74  39 - 117 U/L   Total Bilirubin 0.3  0.3 - 1.2 mg/dL   GFR calc non Af Amer >90  >90 mL/min   GFR calc Af Amer >90  >90 mL/min  TROPONIN I      Result Value Range   Troponin I <0.30  <0.30 ng/mL  URINALYSIS, ROUTINE W REFLEX MICROSCOPIC      Result Value Range   Color, Urine YELLOW  YELLOW   APPearance CLEAR  CLEAR   Specific Gravity, Urine 1.003 (*) 1.005 - 1.030   pH 6.5  5.0 - 8.0   Glucose, UA NEGATIVE  NEGATIVE mg/dL   Hgb urine dipstick NEGATIVE  NEGATIVE   Bilirubin Urine NEGATIVE  NEGATIVE   Ketones, ur NEGATIVE  NEGATIVE mg/dL   Protein, ur NEGATIVE  NEGATIVE mg/dL   Urobilinogen, UA 0.2  0.0 - 1.0 mg/dL   Nitrite NEGATIVE  NEGATIVE   Leukocytes, UA NEGATIVE  NEGATIVE  ETHANOL      Result Value Range   Alcohol, Ethyl (B) <11  0 - 11 mg/dL  POCT I-STAT TROPONIN I      Result Value Range   Troponin i, poc 0.00  0.00 - 0.08 ng/mL   Comment 3            Dg Chest 2 View 12/15/2012  *RADIOLOGY REPORT*  Clinical Data: Right-sided numbness and weakness.  CHEST - 2 VIEW  Comparison: None  Findings: The cardiomediastinal silhouette is unremarkable. The lungs are clear. There is no evidence of focal airspace disease, pulmonary edema, suspicious pulmonary nodule/mass, pleural effusion, or pneumothorax. No acute bony abnormalities are identified.  IMPRESSION: No evidence of active cardiopulmonary disease.   Original Report Authenticated By: Harmon Pier, M.D.    Ct Head Wo Contrast 12/14/2012  *RADIOLOGY REPORT*  Clinical Data: 47 year old male awoke yesterday morning with right side numbness.  Intermittent headache.  CT HEAD WITHOUT CONTRAST   Technique:  Contiguous axial images were obtained from the base of the skull through the vertex without contrast.  Comparison: None.  Findings: Left mastoids and tympanic cavity are opacified.  Other Visualized paranasal sinuses and mastoids are clear.  No acute osseous abnormality identified.  No acute orbit or scalp soft tissue abnormality.  Normal cerebral volume.  No ventriculomegaly. Gray-white matter differentiation is within normal limits throughout the brain.  No evidence of cortically based acute infarction identified.  No midline shift, mass effect, or evidence of mass lesion.  No acute intracranial hemorrhage identified.  Mild calcified atherosclerosis at the skull base.  No suspicious intracranial vascular hyperdensity.  IMPRESSION: 1. Normal noncontrast CT appearance of the brain. 2.  Opacified left tympanic cavity and mastoids compatible with inflammatory process such as otitis media.   Original Report Authenticated By: Erskine Speed, M.D.     Mr [facility] Wo Contrast 12/15/2012  *RADIOLOGY REPORT*  Clinical Data:  Weakness.  Right-sided numbness.  Parasthesias in the face.  Headache beginning the posterior neck and extending to the top of the head.  Evaluate for dissection.  MRI HEAD WITHOUT AND WITH CONTRAST MRA HEAD WITHOUT CONTRAST MRA NECK WITHOUT AND WITH CONTRAST  Technique:  Multiplanar, multiecho pulse sequences of the brain and surrounding structures were obtained without and with intravenous contrast.  Angiographic images of the Circle of Willis were obtained using MRA technique without intravenous contrast.  Angiographic images of the neck were obtained using MRA technique without and with intravenous contrast.  Carotid stenosis measurements (when applicable) are obtained utilizing NASCET criteria, using the distal internal carotid diameter as the denominator.  Contrast: 20mL MULTIHANCE GADOBENATE DIMEGLUMINE 529 MG/ML IV SOLN  Comparison:   None.  MRI HEAD  Findings:  The diffusion weighted  images demonstrate no evidence for acute or subacute infarction.  No hemorrhage or mass lesion is present.  There is no significant white matter disease.  Flow is present in the major intracranial arteries.  The globes and orbits are intact.  The paranasal sinuses are clear.  The left middle ear and mastoid effusion is present.  No obstructing nasopharyngeal lesion is present.  There is minimal fluid in the right mastoid air cells.  The postcontrast images demonstrate no pathologic enhancement.  IMPRESSION:  1.  Normal MRI appearance the brain. 2.  Left middle ear mastoid effusion.  Minimal fluid in the right mastoid air cells.  No obstructing nasopharyngeal lesions are evident.  MRA HEAD  Findings: The internal carotid arteries are within normal limits from high cervical segments to the ICA termini.  The A1 and M1 segments are normal.  The anterior communicating artery is patent. There is mild segmental irregularity of MCA branch vessels bilaterally.  No significant proximal stenosis or occlusion is present.  There are no significant aneurysms.  The left vertebral artery is the dominant vessel.  The basilar artery is small.  There is mild narrowing of proximal left P1 segment.  There is asymmetric attenuation of right PCA branch vessels.  IMPRESSION:  1.  Small vessel disease with segmental attenuation as described. 2.  More focal disease in the right PCA territory with loss of distal branch vessels.  MRA NECK  Findings: The time-of-flight images demonstrate no significant flow disturbance at either carotid bifurcation.  Flow is antegrade in the vertebral arteries.  A standard three-vessel arch configuration is present.  Signal loss at the origin of the vertebral arteries bilaterally is likely artifactual.  The remainder of the vertebral arteries are normal. The left vertebral artery is slightly dominant to the right.  The PICA origins are visualized and normal bilaterally.  Right PCA branch vessels are better  visualized on this study.  The right common carotid arteries are within normal limits.  The bifurcation is unremarkable.  The right internal carotid artery is normal.  The left common carotid artery is within normal limits.  The bifurcation is unremarkable.  The left internal carotid artery is normal.  IMPRESSION: Negative MRA of the neck.   Original Report Authenticated By: Marin Roberts, M.D.    Mr Angiogram Neck W Wo Contrast 12/15/2012  *RADIOLOGY REPORT*  Clinical Data:  Weakness.  Right-sided numbness.  Parasthesias in the face.  Headache beginning the posterior neck and extending to the top of the head.  Evaluate for dissection.  MRI HEAD WITHOUT AND WITH CONTRAST MRA HEAD WITHOUT CONTRAST MRA NECK WITHOUT AND WITH CONTRAST  Technique:  Multiplanar, multiecho pulse sequences of the brain and surrounding structures were obtained without and with intravenous contrast.  Angiographic images of the Circle of Willis were obtained using MRA technique without intravenous contrast. Angiographic images of the neck were obtained using MRA technique without and with intravenous contrast.  Carotid stenosis measurements (when applicable) are obtained utilizing NASCET criteria, using the distal internal carotid diameter as the denominator.  Contrast: 20mL MULTIHANCE GADOBENATE DIMEGLUMINE 529 MG/ML IV SOLN  Comparison:   None.  MRI HEAD  Findings:  The diffusion weighted images demonstrate no evidence for acute or subacute infarction.  No hemorrhage or mass lesion is present.  There is no significant white matter disease.  Flow is present in the major intracranial arteries.  The globes and orbits are intact.  The paranasal sinuses are clear.  The left middle ear and mastoid effusion is present.  No obstructing nasopharyngeal lesion is present.  There is minimal fluid in the right mastoid air cells.  The postcontrast images demonstrate no pathologic enhancement.  IMPRESSION:  1.  Normal MRI appearance the brain. 2.   Left middle ear mastoid effusion.  Minimal fluid in the right mastoid air cells.  No obstructing nasopharyngeal lesions are evident.  MRA HEAD  Findings: The internal carotid arteries are within normal limits from high cervical segments to the ICA termini.  The A1 and M1 segments are normal.  The anterior communicating artery is patent. There is mild segmental irregularity of MCA branch vessels bilaterally.  No significant proximal stenosis or occlusion is present.  There are no significant aneurysms.  The left vertebral artery is the dominant vessel.  The basilar artery is small.  There is mild narrowing of proximal left P1 segment.  There is asymmetric attenuation of right PCA branch vessels.  IMPRESSION:  1.  Small vessel disease with segmental attenuation as described. 2.  More focal disease in the right PCA territory with loss of distal branch vessels.  MRA NECK  Findings: The time-of-flight images demonstrate no significant flow disturbance at either carotid bifurcation.  Flow is antegrade in the vertebral arteries.  A standard three-vessel arch configuration is present.  Signal loss at the origin of the vertebral arteries bilaterally is likely artifactual.  The remainder of the vertebral arteries are normal. The left vertebral artery is slightly dominant to the right.  The PICA origins are visualized and normal bilaterally.  Right PCA branch vessels are better visualized on this study.  The right common carotid arteries are within normal limits.  The bifurcation is unremarkable.  The right internal carotid artery is normal.  The left common carotid artery is within normal limits.  The bifurcation is unremarkable.  The left internal carotid artery is normal.  IMPRESSION: Negative MRA of the neck.   Original Report Authenticated By: Marin Roberts, M.D.    Mr Laqueta Jean ZO Contrast 12/15/2012  *RADIOLOGY REPORT*  Clinical Data:  Weakness.  Right-sided numbness.  Parasthesias in the face.  Headache beginning  the posterior neck and extending to the top of the head.  Evaluate for dissection.  MRI HEAD WITHOUT AND WITH CONTRAST MRA HEAD WITHOUT CONTRAST MRA NECK WITHOUT AND WITH CONTRAST  Technique:  Multiplanar, multiecho pulse sequences of the brain and surrounding structures were obtained without and with intravenous contrast.  Angiographic images of the Circle of Willis were obtained using MRA technique without intravenous contrast. Angiographic images of the neck were obtained using MRA technique without and with intravenous contrast.  Carotid stenosis measurements (when applicable) are obtained utilizing NASCET criteria, using the distal internal carotid diameter as the denominator.  Contrast: 20mL MULTIHANCE GADOBENATE DIMEGLUMINE 529 MG/ML IV SOLN  Comparison:   None.  MRI HEAD  Findings:  The diffusion weighted images demonstrate no evidence for acute or subacute infarction.  No hemorrhage or mass lesion is present.  There is no significant white matter disease.  Flow is present in the major intracranial arteries.  The globes and orbits are intact.  The paranasal sinuses are clear.  The  left middle ear and mastoid effusion is present.  No obstructing nasopharyngeal lesion is present.  There is minimal fluid in the right mastoid air cells.  The postcontrast images demonstrate no pathologic enhancement.  IMPRESSION:  1.  Normal MRI appearance the brain. 2.  Left middle ear mastoid effusion.  Minimal fluid in the right mastoid air cells.  No obstructing nasopharyngeal lesions are evident.  MRA HEAD  Findings: The internal carotid arteries are within normal limits from high cervical segments to the ICA termini.  The A1 and M1 segments are normal.  The anterior communicating artery is patent. There is mild segmental irregularity of MCA branch vessels bilaterally.  No significant proximal stenosis or occlusion is present.  There are no significant aneurysms.  The left vertebral artery is the dominant vessel.  The  basilar artery is small.  There is mild narrowing of proximal left P1 segment.  There is asymmetric attenuation of right PCA branch vessels.  IMPRESSION:  1.  Small vessel disease with segmental attenuation as described. 2.  More focal disease in the right PCA territory with loss of distal branch vessels.  MRA NECK  Findings: The time-of-flight images demonstrate no significant flow disturbance at either carotid bifurcation.  Flow is antegrade in the vertebral arteries.  A standard three-vessel arch configuration is present.  Signal loss at the origin of the vertebral arteries bilaterally is likely artifactual.  The remainder of the vertebral arteries are normal. The left vertebral artery is slightly dominant to the right.  The PICA origins are visualized and normal bilaterally.  Right PCA branch vessels are better visualized on this study.  The right common carotid arteries are within normal limits.  The bifurcation is unremarkable.  The right internal carotid artery is normal.  The left common carotid artery is within normal limits.  The bifurcation is unremarkable.  The left internal carotid artery is normal.  IMPRESSION: Negative MRA of the neck.   Original Report Authenticated By: Marin Roberts, M.D.     2050:  Neuro symptoms continue resolved, exam intact.  Passed swallowing eval. Will dose ASA. Dx and testing d/w pt.  Questions answered.  Verb understanding, agreeable to observation admit for TIA. T/C to [facility] Dr. Onalee Hua, case discussed, including:  HPI, pertinent PM/SHx, VS/PE, dx testing, ED course and treatment:  Agreeable to observation admit, requests to write temporary orders, obtain tele bed to team 10.      Laray Anger, DO 12/17/12 1226

## 2012-12-15 NOTE — ED Notes (Signed)
Pt is here with complaints of right side body numbness on Monday when he woke up and it resolved one hour after he woke up .  Pt has had limited use of right arm since yesterday.  Pt states he does have posterior headache.  Pt was seen at [facility] and seen by Dr. Ignacia Palma and was advised he needed hospitalization for additional testing MRI of brain and MRA of neck to t/o stroke and vertebral artery dissection.

## 2012-12-16 DIAGNOSIS — G4730 Sleep apnea, unspecified: Secondary | ICD-10-CM

## 2012-12-16 LAB — LIPID PANEL
HDL: 30 mg/dL — ABNORMAL LOW (ref >39)
LDL Cholesterol: 162 mg/dL — ABNORMAL HIGH (ref 0–99)
Total CHOL/HDL Ratio: 7.2 RATIO
Triglycerides: 123 mg/dL (ref <150)
VLDL: 25 mg/dL (ref 0–40)

## 2012-12-16 MED ORDER — ATORVASTATIN CALCIUM 20 MG PO TABS
20.0000 mg | ORAL_TABLET | Freq: Every day | ORAL | Status: DC
  Administered 2012-12-16: 20 mg via ORAL
  Filled 2012-12-16: qty 1

## 2012-12-16 MED ORDER — ACETAMINOPHEN 325 MG PO TABS
650.0000 mg | ORAL_TABLET | ORAL | Status: DC | PRN
  Administered 2012-12-16: 650 mg via ORAL
  Filled 2012-12-16: qty 2

## 2012-12-16 MED ORDER — NICOTINE 21 MG/24HR TD PT24: 1.0000 | MEDICATED_PATCH | Freq: Every day | TRANSDERMAL | Status: DC

## 2012-12-16 MED ORDER — ASPIRIN 81 MG PO TBEC: 81.0000 mg | DELAYED_RELEASE_TABLET | Freq: Every day | ORAL | Status: DC

## 2012-12-16 MED ORDER — ATORVASTATIN CALCIUM 20 MG PO TABS: 20.0000 mg | ORAL_TABLET | Freq: Every day | ORAL | Status: DC

## 2012-12-16 NOTE — Evaluation (Signed)
Speech Language Pathology Evaluation Patient Details Name: Brett Glover MRN: [id number] DOB: 1965/12/04 Today's Date: 12/16/2012 Time: 4403-4742 SLP Time Calculation (min): 22 min  Problem List:  Patient Active Problem List  Diagnosis  . HEARING LOSS, LEFT EAR  . TESTICULAR MASS, LEFT  . SLEEP APNEA  . Kidney stones  . Tobacco abuse  . Pneumonia  . History of cervical fracture  . Hyperlipidemia  . TIA (transient ischemic attack)  . Numbness of extremity   Past Medical History:  Past Medical History  Diagnosis Date  . Chicken pox as a child  . Broken jaw 1983  . Kidney stones 1990  . Tobacco abuse 05/19/2012  . History of cervical fracture   . Hyperlipidemia   . Cervical vertebral closed fracture   . Elevated cholesterol    Past Surgical History:  Past Surgical History  Procedure Laterality Date  . Fusion in neck  2004  . Fatty tissue removed  2007    left abdominal wall, lipoma  . Kidney stones removed  1990  . Cervical fusion     HPI:  47 yo male started having rt sided numbness 2 days ago went to urgent care ct head was done negative and advised for admission but pt left AMA.  Pt denies this ever happened.  Comes to ED today to get his MRI done which urgent care recommended yesterday.  MRI/A done today normal.  Pt has had almost complete resolution of his symtpoms except some right facial numbness which is mild.  He did not ever have any weakness in any extremeties but did have some difficulty with swallowing which is back to normal now.  No fevers/ no recent illnesses.  Smoker.   Assessment / Plan / Recommendation Clinical Impression  Pt demonstrates cognitive linguistic function WNL. He reports subjectively slower processing when playing a familiar game, but pt demonstrated rapid processing and divided attention while playing and explaining the game that indicated an absence of impairment. Pt did have questionable facial, labial asymmetry, but strength and function  WNL. Question if pt may have some residual right sided physical deficits that may also impact reaction time of right hand during game as well. Offered stroke education to pt. No acute SLP f/u needed at this time. Will sign off.     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)   Cognition  Overall Cognitive Status: Appears within functional limits for tasks assessed Orientation Level: Oriented X4    Comprehension  Auditory Comprehension Overall Auditory Comprehension: Appears within functional limits for tasks assessed    Expression Verbal Expression Overall Verbal Expression: Appears within functional limits for tasks assessed   Oral / Motor Oral Motor/Sensory Function Overall Oral Motor/Sensory Function: Impaired (pooling of saliva in right buccal cavity?) Labial ROM: Within Functional Limits Labial Symmetry: Abnormal symmetry right Labial Strength: Within Functional Limits Labial Sensation: Within Functional Limits Lingual ROM: Within Functional Limits Lingual Symmetry: Within Functional Limits Lingual Strength: Within Functional Limits Lingual Sensation: Within Functional Limits Facial ROM: Within Functional Limits Facial Symmetry: Right droop Facial Strength: Within Functional Limits Facial Sensation: Within Functional Limits Velum: Within Functional Limits Mandible:  Within Functional Limits Motor Speech Overall Motor Speech: Appears within functional limits for tasks assessed   GO    Harlon Ditty, MA CCC-SLP [phone]  Claudine Mouton 12/16/2012, 3:33 PM

## 2012-12-16 NOTE — Progress Notes (Signed)
VASCULAR LAB PRELIMINARY  PRELIMINARY  PRELIMINARY  PRELIMINARY  Carotid duplex  completed.    Preliminary report:  Bilateral:  No evidence of hemodynamically significant internal carotid artery stenosis.   Vertebral artery flow is antegrade.      Jara Feider, RVT 12/16/2012, 11:34 AM

## 2012-12-16 NOTE — Progress Notes (Signed)
@@@   Will Sign Off, pt declined PT@@@  PT Cancellation Note  Patient Details Name: MUNG RINKER MRN: [id number] DOB: 1966/05/25   Cancelled Treatment:    Reason Eval/Treat Not Completed: Other (comment) (pt cancel PT evaluation). Sign off. 12/16/2012  Pound Bing, PT [phone] (pager)   Yen Wandell, Eliseo Gum 12/16/2012, 4:52 PM

## 2012-12-16 NOTE — Progress Notes (Signed)
Pt discharged to home per MD order. Pt received and reviewed all discharge instructions and medication information including follow-up appointments and prescriptions. Pt also reviewed stroke discharge education. Pt verbalized understanding. Pt alert and oriented at discharge with no complaints. Pt ambulated to private vehicle per pt request. Efraim Kaufmann

## 2012-12-16 NOTE — Discharge Summary (Signed)
Physician Discharge Summary  Patient ID: Brett Glover MRN: [id number] DOB/AGE: 28-Oct-1965 47 y.o.  Admit date: 12/15/2012 Discharge date: 12/16/2012  Primary Care Physician:  Danise Edge, MD  Discharge Diagnoses:     . TIA (transient ischemic attack) . Hyperlipidemia . Tobacco abuse . Numbness of extremity hyperlipidemia  Consults:  None  Discharge Instructions:   Discharge Medications:   Medication List    TAKE these medications       aspirin 81 MG EC tablet  Commonly known as:  aspirin EC  Take 1 tablet (81 mg total) by mouth daily. Swallow whole.     atorvastatin 20 MG tablet  Commonly known as:  LIPITOR  Take 1 tablet (20 mg total) by mouth daily.     naproxen sodium 220 MG tablet  Commonly known as:  ANAPROX  Take 220 mg by mouth as needed.     nicotine 21 mg/24hr patch  Commonly known as:  NICODERM CQ - dosed in mg/24 hours  Place 1 patch onto the skin daily.         Brief H and P: For complete details please refer to admission H and P, but in brief 47 yo male started having rt sided numbness 2 days ago went to urgent care ct head was done negative and advised for admission but pt left AMA. Pt again presented to [facility] ED today to get his MRI done which urgent care recommended a day before. MRI/A done was normal. Pt has had almost complete resolution of his symtpoms except some right facial numbness which is mild. He did not ever have any weakness in any extremeties but did have some difficulty with swallowing which is back to normal now. No fevers/ no recent illnesses. Smoker.   Hospital Course:     TIA (transient ischemic attack) Patient was admitted for TIA workup, MRI of the brain was done which showed normal MRI with no acute intracranial abnormalities. MRA of the brain showed small vessel disease with segmental attenuation, more focal disease in the right PCA territory. MRA of the neck was negative. Carotid Dopplers were done which showed no  significant internal carotid artery stenosis. 2-D echo showed EF 55-60%, normal wall motion, trivial pericardial effusion.  Lipid panel showed cholesterol 217, LDL of 162, patient was started on Lipitor  Active Problems:   Tobacco abuse: Patient started on a nicotine patch, he was counseled on smoking cessation    Hyperlipidemia: Patient was placed on Lipitor less than 100,     Numbness of extremity: Resolved likely TIA   Day of Discharge BP 119/68  Pulse 81  Temp(Src) 97.9 F (36.6 C) (Oral)  Resp 18  Ht 6' (1.829 m)  Wt 98.34 kg (216 lb 12.8 oz)  BMI 29.4 kg/m2  SpO2 93%  Physical Exam: General: Alert and awake oriented x3 not in any acute distress. HEENT: anicteric sclera, pupils reactive to light and accommodation CVS: S1-S2 clear no murmur rubs or gallops Chest: clear to auscultation bilaterally, no wheezing rales or rhonchi Abdomen: soft nontender, nondistended, normal bowel sounds, no organomegaly Extremities: no cyanosis, clubbing or edema noted bilaterally Neuro: Cranial nerves II-XII intact, no focal neurological deficits   The results of significant diagnostics from this hospitalization (including imaging, microbiology, ancillary and laboratory) are listed below for reference.    LAB RESULTS: Basic Metabolic Panel:  Recent Labs Lab 12/14/12 1825 12/15/12 1721  NA 139 138  K 4.0 3.9  CL 103 102  CO2 23 23  GLUCOSE 99  89  BUN 13 11  CREATININE 0.90 0.80  CALCIUM 9.6 9.5   Liver Function Tests:  Recent Labs Lab 12/14/12 1825 12/15/12 1721  AST 17 16  ALT 15 14  ALKPHOS 74 74  BILITOT 0.4 0.3  PROT 7.3 7.2  ALBUMIN 4.1 4.0   No results found for this basename: LIPASE, AMYLASE,  in the last 168 hours No results found for this basename: AMMONIA,  in the last 168 hours CBC:  Recent Labs Lab 12/14/12 1825 12/15/12 1721  WBC 8.3 8.5  NEUTROABS 4.1 5.0  HGB 16.3 16.4  HCT 46.9 45.8  MCV 89.7 89.6  PLT 187 183   Cardiac Enzymes:  Recent  Labs Lab 12/14/12 1825 12/15/12 1721  TROPONINI <0.30 <0.30   BNP: No components found with this basename: POCBNP,  CBG: No results found for this basename: GLUCAP,  in the last 168 hours  Significant Diagnostic Studies:  Dg Chest 2 View  12/15/2012  *RADIOLOGY REPORT*  Clinical Data: Right-sided numbness and weakness.  CHEST - 2 VIEW  Comparison: None  Findings: The cardiomediastinal silhouette is unremarkable. The lungs are clear. There is no evidence of focal airspace disease, pulmonary edema, suspicious pulmonary nodule/mass, pleural effusion, or pneumothorax. No acute bony abnormalities are identified.  IMPRESSION: No evidence of active cardiopulmonary disease.   Original Report Authenticated By: Harmon Pier, M.D.    Mr [facility] Wo Contrast  12/15/2012    IMPRESSION:  1.  Normal MRI appearance the brain. 2.  Left middle ear mastoid effusion.  Minimal fluid in the right mastoid air cells.  No obstructing nasopharyngeal lesions are evident.    MRA HEAD  IMPRESSION:  1.  Small vessel disease with segmental attenuation as described. 2.  More focal disease in the right PCA territory with loss of distal branch vessels.    Mr Angiogram Neck W Wo Contrast   IMPRESSION: Negative MRA of the neck.   Original Report Authenticated By: Marin Roberts, M.D.    Carotid duplex 12/16/12  Bilateral: No evidence of hemodynamically significant internal carotid artery stenosis. Vertebral artery flow is antegrade.    2D ECHO: Study Conclusions  - Left ventricle: The cavity size was normal. Systolic function was normal. The estimated ejection fraction was in the range of 55% to 60%. Wall motion was normal; there were no regional wall motion abnormalities. - Pericardium, extracardiac: A trivial pericardial effusion was identified. Impressions: - No cardiac source of embolism was identified, but cannot be ruled out on the basis of this examination.    Disposition and Follow-up:      Discharge Orders   Future Orders Complete By Expires     Diet - low sodium heart healthy  As directed     Increase activity slowly  As directed         DISPOSITION: home DIET: heart healthy  ACTIVITY: as tolerated    DISCHARGE FOLLOW-UP Follow-up Information   Follow up with Danise Edge, MD. Schedule an appointment as soon as possible for a visit in 10 days. [facility] FOLLOW-UP)    Contact information:   [address] [place] [address] [phone]       Time spent on Discharge: 35 mins  Signed:   Faye Strohman M.D. [facility] 12/16/2012, 2:14 PM Pager: [phone]

## 2012-12-16 NOTE — Progress Notes (Signed)
Echocardiogram 2D Echocardiogram has been performed.  Brett Glover 12/16/2012, 9:28 AM

## 2012-12-17 NOTE — Progress Notes (Signed)
12/16/12 1500  SLP G-Codes **NOT FOR INPATIENT CLASS**  Functional Assessment Tool Used clinical judgement  Functional Limitations Attention  Attention Current Status (Z6109) [facility]  Attention Goal Status (U0454) [facility]  Attention Discharge Status (U9811) [facility]  SLP Evaluations  $ SLP Speech Visit 1 Procedure  SLP Evaluations  $ SLP EVAL LANGUAGE/SOUND PRODUCTION 1 Procedure  Harlon Ditty, MA CCC-SLP [phone]

## 2013-05-23 ENCOUNTER — Ambulatory Visit
Admission: RE | Admit: 2013-05-23 | Discharge: 2013-05-23 | Disposition: A | Discharge status: Home / Self Care | Payer: No Typology Code available for payment source | Source: Ambulatory Visit | Attending: Occupational Medicine | Admitting: Occupational Medicine

## 2013-05-23 ENCOUNTER — Other Ambulatory Visit: Payer: Self-pay | Admitting: Occupational Medicine

## 2013-05-23 DIAGNOSIS — Z021 Encounter for pre-employment examination: Secondary | ICD-10-CM

## 2014-01-13 ENCOUNTER — Emergency Department (HOSPITAL_COMMUNITY)
Admission: EM | Admit: 2014-01-13 | Discharge: 2014-01-14 | Disposition: A | Discharge status: Home / Self Care | Payer: Worker's Compensation | Attending: Emergency Medicine | Admitting: Emergency Medicine

## 2014-01-13 ENCOUNTER — Encounter (HOSPITAL_COMMUNITY): Payer: Self-pay | Admitting: Emergency Medicine

## 2014-01-13 ENCOUNTER — Emergency Department (HOSPITAL_COMMUNITY): Payer: Worker's Compensation

## 2014-01-13 DIAGNOSIS — Y9389 Activity, other specified: Secondary | ICD-10-CM | POA: Insufficient documentation

## 2014-01-13 DIAGNOSIS — E785 Hyperlipidemia, unspecified: Secondary | ICD-10-CM | POA: Insufficient documentation

## 2014-01-13 DIAGNOSIS — M25511 Pain in right shoulder: Secondary | ICD-10-CM

## 2014-01-13 DIAGNOSIS — W1809XA Striking against other object with subsequent fall, initial encounter: Secondary | ICD-10-CM | POA: Insufficient documentation

## 2014-01-13 DIAGNOSIS — S46909A Unspecified injury of unspecified muscle, fascia and tendon at shoulder and upper arm level, unspecified arm, initial encounter: Secondary | ICD-10-CM | POA: Insufficient documentation

## 2014-01-13 DIAGNOSIS — E7800 Pure hypercholesterolemia, unspecified: Secondary | ICD-10-CM | POA: Insufficient documentation

## 2014-01-13 DIAGNOSIS — Z8781 Personal history of (healed) traumatic fracture: Secondary | ICD-10-CM | POA: Insufficient documentation

## 2014-01-13 DIAGNOSIS — X500XXA Overexertion from strenuous movement or load, initial encounter: Secondary | ICD-10-CM | POA: Insufficient documentation

## 2014-01-13 DIAGNOSIS — Y9289 Other specified places as the place of occurrence of the external cause: Secondary | ICD-10-CM | POA: Insufficient documentation

## 2014-01-13 DIAGNOSIS — S4980XA Other specified injuries of shoulder and upper arm, unspecified arm, initial encounter: Secondary | ICD-10-CM | POA: Insufficient documentation

## 2014-01-13 DIAGNOSIS — F17200 Nicotine dependence, unspecified, uncomplicated: Secondary | ICD-10-CM | POA: Insufficient documentation

## 2014-01-13 DIAGNOSIS — Y990 Civilian activity done for income or pay: Secondary | ICD-10-CM | POA: Insufficient documentation

## 2014-01-13 DIAGNOSIS — Z87442 Personal history of urinary calculi: Secondary | ICD-10-CM | POA: Insufficient documentation

## 2014-01-13 MED ORDER — IBUPROFEN 400 MG PO TABS
400.0000 mg | ORAL_TABLET | Freq: Once | ORAL | Status: DC
  Filled 2014-01-13: qty 2

## 2014-01-13 MED ORDER — OXYCODONE-ACETAMINOPHEN 5-325 MG PO TABS
1.0000 | ORAL_TABLET | Freq: Once | ORAL | Status: DC
  Filled 2014-01-13: qty 1

## 2014-01-13 MED ORDER — NAPROXEN 250 MG PO TABS
500.0000 mg | ORAL_TABLET | Freq: Once | ORAL | Status: AC
  Administered 2014-01-13: 500 mg via ORAL
  Filled 2014-01-13: qty 2

## 2014-01-13 NOTE — ED Notes (Signed)
Ice applied. PBT with pt for w/c drug screen.

## 2014-01-13 NOTE — ED Notes (Addendum)
Slipped on grate at work, fell onto R shoulder. C/o shoulder pain. CMS intact. Decreased ROM & R Grip weaker "d/t pain with movement". No meds PTA. No obvious deformity. Needs a UDS done for work purposes.

## 2014-01-13 NOTE — ED Notes (Signed)
Pt requesting Aleve versus percocet and ibuprofen.  EDPA to change orders.

## 2014-01-13 NOTE — ED Notes (Addendum)
PBT called for employer w/c UDS, works for: "[employer]". 

## 2014-01-13 NOTE — ED Provider Notes (Signed)
CSN: [id number]     Arrival date & time 01/13/14  2145 History  This chart was scribed for non-physician practitioner Dierdre ForthHannah Nycholas Rayner, PA-C working with Gavin PoundMichael Y. Oletta LamasGhim, MD by Valera CastleSteven Perry, ED scribe. This patient was seen in room TR06C/TR06C and the patient's care was started at 10:36 PM.   Chief Complaint  Patient presents with  . Shoulder Injury  . Shoulder Pain  . Fall   (Consider location/radiation/quality/duration/timing/severity/associated sxs/prior Treatment) The history is provided by the patient and medical records. No language interpreter was used.   HPI Comments: Brett Glover is a 48 y.o. male who presents to the Emergency Department complaining of constant right shoulder pain, onset earlier this evening at work after slipping on a grate, trying to catch himself with his right arm and feeling a pop in his shoulder after hitting a ledge. He is unsure where he struck the right arm, but his pain is localized to the right shoulder in the joint.  He reports h/o neck fusion, states he was trying to avoid re-injury to his neck. He denies obvious deformity, wounds. He denies numbness, tingling, weakness, or paresthesias.  Pt reports he landed squarely on his buttocks and denies hitting his head LOC, neck or back pain.    PCP - Danise EdgeBLYTH, STACEY, MD  Past Medical History  Diagnosis Date  . Chicken pox as a child  . Broken jaw 1983  . Kidney stones 1990  . Tobacco abuse 05/19/2012  . History of cervical fracture   . Hyperlipidemia   . Cervical vertebral closed fracture   . Elevated cholesterol    Past Surgical History  Procedure Laterality Date  . Fusion in neck  2004  . Fatty tissue removed  2007    left abdominal wall, lipoma  . Kidney stones removed  1990  . Cervical fusion     Family History  Problem Relation Age of Onset  . Seizures Mother   . ADD / ADHD Daughter   . Mental illness Daughter     bipolar, add  . Heart disease Sister    History  Substance Use Topics   . Smoking status: Current Every Day Smoker -- 1.00 packs/day for 30 years    Types: Cigarettes  . Smokeless tobacco: Not on file  . Alcohol Use: Yes    Review of Systems  Constitutional: Negative for fever and chills.  Gastrointestinal: Negative for nausea and vomiting.  Musculoskeletal: Positive for arthralgias (right shoulder). Negative for back pain, joint swelling, myalgias, neck pain and neck stiffness.  Skin: Negative for wound.  Neurological: Negative for syncope, weakness and numbness.  Hematological: Does not bruise/bleed easily.  Psychiatric/Behavioral: The patient is not nervous/anxious.   All other systems reviewed and are negative.   Allergies  Review of patient's allergies indicates no known allergies.  Home Medications   Current Outpatient Rx  Name  Route  Sig  Dispense  Refill  . HYDROcodone-acetaminophen (NORCO/VICODIN) 5-325 MG per tablet   Oral   Take 1-2 tablets by mouth every 4 (four) hours as needed.   20 tablet   0   . methocarbamol (ROBAXIN) 500 MG tablet   Oral   Take 1 tablet (500 mg total) by mouth 2 (two) times daily as needed for muscle spasms.   20 tablet   0    BP 134/90  Pulse 91  Temp(Src) 98 F (36.7 C) (Oral)  Resp 18  SpO2 92%  Physical Exam  Nursing note and vitals reviewed. Constitutional: He is  oriented to person, place, and time. He appears well-developed and well-nourished. No distress.  HENT:  Head: Normocephalic and atraumatic.  Eyes: Conjunctivae are normal.  Neck: Normal range of motion and full passive range of motion without pain. No spinous process tenderness and no muscular tenderness present. No rigidity. Normal range of motion present.  Full ROM without pain No midline or paraspinal tenderness  Cardiovascular: Normal rate, regular rhythm, normal heart sounds and intact distal pulses.   No murmur heard. Pulses:      Radial pulses are 2+ on the right side, and 2+ on the left side.  Capillary refill < 3 sec   Pulmonary/Chest: Effort normal and breath sounds normal. No respiratory distress. He has no wheezes.  Musculoskeletal: He exhibits tenderness. He exhibits no edema.       Right shoulder: He exhibits decreased range of motion (significantly 2/2 pain) and pain. He exhibits no tenderness, no bony tenderness, no swelling, no effusion, no crepitus, no deformity, no laceration, no spasm, normal pulse and normal strength.       Arms: ROM: significantly decreased ROM of the right shoulder 2/2 to pain.  He has full ROM with flexion/extension of the right elbow, wrist and fingers; normal pronation and supination.  Significant pain with external rotation of the right shoulder No visible or palpable deformity of the shoulder or RUE  Full ROM of the T-spine and L-spine No midline or paraspinal tenderness of the T-spine or L-spine  Neurological: He is alert and oriented to person, place, and time. Coordination normal.  Sensation intact to dull and sharp in the entire RUE Strength strong grip strength and 5/5 flexion ans extension against resistance of the elbow and wrist.  0/5 strength in the right as exam is severely limited by pain  Skin: Skin is warm and dry. No rash noted. He is not diaphoretic. No erythema.  No tenting of the skin  Psychiatric: He has a normal mood and affect.    ED Course  Procedures (including critical care time)  DIAGNOSTIC STUDIES: Oxygen Saturation is 92% on room air, adequate by my interpretation.    COORDINATION OF CARE: 10:42 PM-Discussed treatment plan which includes pain medication with pt at bedside and pt agreed to plan.   Dg Shoulder Right  01/13/2014   CLINICAL DATA:  Fall  EXAM: RIGHT SHOULDER - 2+ VIEW  COMPARISON:  None available  FINDINGS: No acute fracture or dislocation identified. The humeral head is in normal alignment with the glenoid. Degenerative changes noted about the [facility] joint which remains grossly approximated. No periarticular soft tissue  calcification. No soft tissue abnormality.  IMPRESSION: No acute fracture or dislocation.   Electronically Signed   By: Rise Mu M.D.   On: 01/13/2014 22:45     EKG Interpretation None     Medications  naproxen (NAPROSYN) tablet 500 mg (500 mg Oral Given 01/13/14 2249)   MDM   Final diagnoses:  Arthralgia of right shoulder region    Brett Glover presents with right shoulder pain after fall at work earlier in the evening, striking his RUE on a ledge.  No visible or palpable deformity.  X-ray pending.  Pt refusing pain control asking for aleve.  UDS has been obtained for workers compensation.    12:37 AM Patient X-Rays negative for obvious fracture or dislocation. I personally reviewed the imaging tests through PACS system.  I reviewed available ER/hospitalization records through the EMR.    Pain managed in ED with aleve. Pt advised  to follow up with orthopedics if symptoms persist for further evaluation and treatment.  Exam is limited by patient pain.  Patient given sling while in ED, conservative therapy recommended and discussed. Patient will be dc home & is agreeable with above plan.  It has been determined that no acute conditions requiring further emergency intervention are present at this time. The patient/guardian have been advised of the diagnosis and plan. We have discussed signs and symptoms that warrant return to the ED, such as changes or worsening in symptoms.   Vital signs are stable at discharge.   BP 134/90  Pulse 91  Temp(Src) 98 F (36.7 C) (Oral)  Resp 18  SpO2 92%  Patient/guardian has voiced understanding and agreed to follow-up with the PCP or specialist.    I personally performed the services described in this documentation, which was scribed in my presence. The recorded information has been reviewed and is accurate.    Dahlia Client Cleburn Maiolo, PA-C 01/14/14 [phone]

## 2014-01-14 ENCOUNTER — Emergency Department (HOSPITAL_COMMUNITY): Payer: Worker's Compensation

## 2014-01-14 MED ORDER — HYDROCODONE-ACETAMINOPHEN 5-325 MG PO TABS: 1.0000 | ORAL_TABLET | ORAL | Status: DC | PRN

## 2014-01-14 MED ORDER — METHOCARBAMOL 500 MG PO TABS: 500.0000 mg | ORAL_TABLET | Freq: Two times a day (BID) | ORAL | Status: DC | PRN

## 2014-01-14 NOTE — ED Notes (Signed)
Pt. Returned from xray 

## 2014-01-14 NOTE — Discharge Instructions (Signed)
1. Medications: vicodin for pain, robaxin for muscle relaxer, usual home medications 2. Treatment: rest, drink plenty of fluids,  3. Follow Up: Please followup with Orthopedics for further evaluation on Monday   Shoulder Pain The shoulder is the joint that connects your arms to your body. The bones that form the shoulder joint include the upper arm bone (humerus), the shoulder blade (scapula), and the collarbone (clavicle). The top of the humerus is shaped like a ball and fits into a rather flat socket on the scapula (glenoid cavity). A combination of muscles and strong, fibrous tissues that connect muscles to bones (tendons) support your shoulder joint and hold the ball in the socket. Small, fluid-filled sacs (bursae) are located in different areas of the joint. They act as cushions between the bones and the overlying soft tissues and help reduce friction between the gliding tendons and the bone as you move your arm. Your shoulder joint allows a wide range of motion in your arm. This range of motion allows you to do things like scratch your back or throw a ball. However, this range of motion also makes your shoulder more prone to pain from overuse and injury. Causes of shoulder pain can originate from both injury and overuse and usually can be grouped in the following four categories:  Redness, swelling, and pain (inflammation) of the tendon (tendinitis) or the bursae (bursitis).  Instability, such as a dislocation of the joint.  Inflammation of the joint (arthritis).  Broken bone (fracture). HOME CARE INSTRUCTIONS   Apply ice to the sore area.  Put ice in a plastic bag.  Place a towel between your skin and the bag.  Leave the ice on for 15-20 minutes, 03-04 times per day for the first 2 days.  Stop using cold packs if they do not help with the pain.  If you have a shoulder sling or immobilizer, wear it as long as your caregiver instructs. Only remove it to shower or bathe. Move your arm  as little as possible, but keep your hand moving to prevent swelling.  Squeeze a soft ball or foam pad as much as possible to help prevent swelling.  Only take over-the-counter or prescription medicines for pain, discomfort, or fever as directed by your caregiver. SEEK MEDICAL CARE IF:   Your shoulder pain increases, or new pain develops in your arm, hand, or fingers.  Your hand or fingers become cold and numb.  Your pain is not relieved with medicines. SEEK IMMEDIATE MEDICAL CARE IF:   Your arm, hand, or fingers are numb or tingling.  Your arm, hand, or fingers are significantly swollen or turn white or blue. MAKE SURE YOU:   Understand these instructions.  Will watch your condition.  Will get help right away if you are not doing well or get worse. Document Released: 07/16/2005 Document Revised: 06/30/2012 Document Reviewed: 09/20/2011 [facility] Patient Information 2014 [employer], [place].

## 2014-01-16 NOTE — ED Provider Notes (Signed)
Medical screening examination/treatment/procedure(s) were performed by non-physician practitioner and as supervising physician I was immediately available for consultation/collaboration.  Gavin PoundMichael Y. Oletta LamasGhim, MD 01/16/14 [phone]

## 2014-12-21 ENCOUNTER — Ambulatory Visit: Payer: Self-pay

## 2014-12-21 ENCOUNTER — Other Ambulatory Visit: Payer: Self-pay | Admitting: Occupational Medicine

## 2014-12-21 DIAGNOSIS — M25511 Pain in right shoulder: Secondary | ICD-10-CM

## 2015-06-06 ENCOUNTER — Ambulatory Visit: Payer: Worker's Compensation | Attending: Orthopedic Surgery | Admitting: Physical Therapy

## 2015-06-06 DIAGNOSIS — R531 Weakness: Secondary | ICD-10-CM | POA: Diagnosis present

## 2015-06-06 DIAGNOSIS — M25611 Stiffness of right shoulder, not elsewhere classified: Secondary | ICD-10-CM | POA: Diagnosis present

## 2015-06-06 DIAGNOSIS — M25511 Pain in right shoulder: Secondary | ICD-10-CM

## 2015-06-06 DIAGNOSIS — R609 Edema, unspecified: Secondary | ICD-10-CM | POA: Diagnosis present

## 2015-06-06 NOTE — Therapy (Signed)
[facility] [address] [place], [place], [address] Phone: [phone]   Fax:  [phone]  Physical Therapy Evaluation  Patient Details  Name: Brett Glover MRN: [id number] Date of Birth: Aug 20, 1966 Referring Provider:  Beverely Low, MD  Encounter Date: 06/06/2015      PT End of Session - 06/06/15 1046    Visit Number 1   Number of Visits 12   Date for PT Re-Evaluation 07/06/15   PT Start Time 1046   PT Stop Time 1114   PT Time Calculation (min) 28 min   Activity Tolerance Patient tolerated treatment well   Behavior During Therapy [facility] for tasks assessed/performed      Past Medical History  Diagnosis Date  . Chicken pox as a child  . Broken jaw 1983  . Kidney stones 1990  . Tobacco abuse 05/19/2012  . History of cervical fracture   . Hyperlipidemia   . Cervical vertebral closed fracture   . Elevated cholesterol     Past Surgical History  Procedure Laterality Date  . Fusion in neck  2004  . Fatty tissue removed  2007    left abdominal wall, lipoma  . Kidney stones removed  1990  . Cervical fusion      There were no vitals filed for this visit.  Visit Diagnosis:  Stiffness of right shoulder joint - Plan: PT plan of care cert/re-cert  Weakness - Plan: PT plan of care cert/re-cert  Pain in right shoulder - Plan: PT plan of care cert/re-cert  Edema - Plan: PT plan of care cert/re-cert      Subjective Assessment - 06/06/15 1053    Subjective Patient originally hurt shoudler in March 2015 and then had surgery July 2015. Pt had therapy and then returned to work and reinjured his shoulder including fracturing scapula. After that healed he returned to work again and reinjured shoulder again in March 2016.  Surgery was 8/816 and the repair was of a full thickness tear that likely occurred in the initial incident.    Pertinent History see above   Patient Stated Goals get full motion and strength   Currently in Pain?  Yes  only with certain movements   Pain Score 10-Worst pain ever   Pain Location Shoulder   Pain Orientation Right   Pain Descriptors / Indicators Sharp  disipates after 20-30 min   Pain Type Surgical pain   Pain Onset 1 to 4 weeks ago   Pain Frequency Intermittent   Aggravating Factors  external rotation of the arm   Pain Relieving Factors ice, rest   Effect of Pain on Daily Activities cant doADLs or work            [facility] PT Assessment - 06/06/15 0001    Assessment   Medical Diagnosis s/p Rt RC surgery   Onset Date/Surgical Date 05/28/15   Hand Dominance Right   Next MD Visit 07/03/15   Precautions   Precautions Shoulder   Type of Shoulder Precautions RCR   Precaution Comments Pt reported that Dr. Ranell Patrick told him to do seated active ER (hitchhiker)   Balance Screen   Has the patient fallen in the past 6 months No   Has the patient had a decrease in activity level because of a fear of falling?  No   Is the patient reluctant to leave their home because of a fear of falling?  No   Prior Function   Level of Independence Independent with basic ADLs  Vocation On disability;Unemployed   Vocation Requirements 50# lifting rqt   Observation/Other Assessments   Focus on Therapeutic Outcomes (FOTO)  93% limited   Posture/Postural Control   Posture Comments rounded shoulders   ROM / Strength   AROM / PROM / Strength PROM;Strength   AROM   AROM Assessment Site Shoulder   Right/Left Shoulder Right   PROM   PROM Assessment Site Shoulder   Right/Left Shoulder Right   Right Shoulder Extension 50 Degrees   Right Shoulder Flexion 90 Degrees   Right Shoulder ABduction 75 Degrees   Right Shoulder Internal Rotation --  to belly at 45 deg ABD   Right Shoulder External Rotation 20 Degrees  at 45 deg ABD   Strength   Overall Strength Comments rt elbow 4+/5;   Palpation   Palpation comment tender around ant incision                           PT Education -  06/06/15 1237    Education provided Yes   Education Details Patient educated about following protocol to allow tissues to heal. Modified his table slide to be PROM instead of AAROM.    Person(s) Educated Patient   Methods Explanation;Demonstration   Comprehension Verbalized understanding;Returned demonstration          PT Short Term Goals - 06/06/15 1249    PT SHORT TERM GOAL #1   Title Pt to verbalize understanding of protecting healing tissues to prevent reinjury.   Time 2   Period Weeks   Status New   PT SHORT TERM GOAL #2   Title I with HEP   Time 4   Period Weeks   Status New   PT SHORT TERM GOAL #3   Title decrease pain by 50%   Time 4   Period Weeks   Status New           PT Long Term Goals - 06/06/15 1255    PT LONG TERM GOAL #1   Title I with advanced HEP   Time 8   Period Weeks   Status New   PT LONG TERM GOAL #2   Title improve Rt shoulder flex/abd to 150 or greater to perform ADLS   Time 8   Period Weeks   Status New   PT LONG TERM GOAL #3   Title Improve right shoulder ER to 70 degrees or greater and IR within functional limits   Time 8   Period Weeks   Status New   PT LONG TERM GOAL #4   Title demo shoulder strength WFL for ADLS   Time 8   Period Weeks   Status New   PT LONG TERM GOAL #5   Title report pain 2/10 or less in right shoulder   Time 8   Period Weeks   Status New               Plan - 06/06/15 1243    Clinical Impression Statement Patient was 20 min late for appt. He is s/p Rt RCR performed 05/28/15. He saw Dr. Ranell Patrick yesterday. Patient has a sling but was not weaing it. He stated that he couldn't start therapy until today so he has been doing it on his own including AAROM for flex and active ER (seated hitchhiker). The latter he said Dr. Ranell Patrick told him to do. He denies pain today, but does have 9/10 pain with certain movements. He is limited with  ROM and strength as expected. Patient will benefit from PT to address these  deficits.   Pt will benefit from skilled therapeutic intervention in order to improve on the following deficits Decreased range of motion;Decreased safety awareness;Impaired UE functional use;Pain;Decreased strength;Increased edema;Postural dysfunction   Rehab Potential Excellent   PT Frequency 3x / week   PT Duration 4 weeks   PT Treatment/Interventions ADLs/Self Care Home Management;Cryotherapy;Electrical Stimulation;Ultrasound;Patient/family education;Neuromuscular re-education;Therapeutic exercise;Manual techniques;Scar mobilization;Passive range of motion;Vasopneumatic Device   PT Next Visit Plan per RCR protocol, ROM, modalities PRN   Consulted and Agree with Plan of Care Patient         Problem List Patient Active Problem List   Diagnosis Date Noted  . TIA (transient ischemic attack) 12/15/2012  . Numbness of extremity 12/15/2012  . Tobacco abuse 05/19/2012  . Pneumonia 05/19/2012  . Kidney stones   . History of cervical fracture   . Hyperlipidemia   . HEARING LOSS, LEFT EAR 04/16/2009  . TESTICULAR MASS, LEFT 04/16/2009  . SLEEP APNEA 04/16/2009   Solon Palm PT  06/06/2015, 1:04 PM  [facility] [address] [place], [place], [address] Phone: [phone]   Fax:  [phone]

## 2015-06-11 ENCOUNTER — Ambulatory Visit: Payer: Worker's Compensation | Admitting: Physical Therapy

## 2015-06-11 DIAGNOSIS — R531 Weakness: Secondary | ICD-10-CM

## 2015-06-11 DIAGNOSIS — M25511 Pain in right shoulder: Secondary | ICD-10-CM

## 2015-06-11 DIAGNOSIS — R609 Edema, unspecified: Secondary | ICD-10-CM

## 2015-06-11 DIAGNOSIS — M25611 Stiffness of right shoulder, not elsewhere classified: Secondary | ICD-10-CM

## 2015-06-11 NOTE — Therapy (Signed)
[facility] [address] [place], [place], [address] Phone: [phone]   Fax:  [phone]  Physical Therapy Treatment  Patient Details  Name: Brett Glover MRN: [id number] Date of Birth: 08-19-1966 Referring Provider:  Bradd Canary, MD  Encounter Date: 06/11/2015      PT End of Session - 06/11/15 1422    Visit Number 2   Number of Visits 12   Date for PT Re-Evaluation 07/06/15   PT Start Time 1406   PT Stop Time 1434   PT Time Calculation (min) 28 min   Activity Tolerance Patient limited by pain;Patient tolerated treatment well   Behavior During Therapy [facility] for tasks assessed/performed      Past Medical History  Diagnosis Date  . Chicken pox as a child  . Broken jaw 1983  . Kidney stones 1990  . Tobacco abuse 05/19/2012  . History of cervical fracture   . Hyperlipidemia   . Cervical vertebral closed fracture   . Elevated cholesterol     Past Surgical History  Procedure Laterality Date  . Fusion in neck  2004  . Fatty tissue removed  2007    left abdominal wall, lipoma  . Kidney stones removed  1990  . Cervical fusion      There were no vitals filed for this visit.  Visit Diagnosis:  Stiffness of right shoulder joint  Weakness  Pain in right shoulder  Edema      Subjective Assessment - 06/11/15 1420    Subjective Reports increased R shoulder pain since yesterday 06/10/2015. Has not been taking pain medication. States that he is to only wear sling when in public. Reports Dr. Ranell Patrick told him to complete AROM hitchhiker, AAROM R shoulder flexion using LUE to lift RUE, and pendulums.   Pertinent History see above   Patient Stated Goals get full motion and strength   Currently in Pain? Yes   Pain Score 8    Pain Location Shoulder   Pain Orientation Right   Pain Descriptors / Indicators Other (Comment)  "Flat solid pain"   Pain Type Surgical pain   Pain Onset 1 to 4 weeks ago   Pain Frequency  Intermittent            [facility] PT Assessment - 06/11/15 0001    Assessment   Medical Diagnosis s/p Rt RC surgery   Onset Date/Surgical Date 05/28/15   Hand Dominance Right   Next MD Visit 07/03/15   Precautions   Precautions Shoulder   Type of Shoulder Precautions RCR   Precaution Comments Pt reported that Dr. Ranell Patrick told him to do seated active ER (hitchhiker)                     [facility] Adult PT Treatment/Exercise - 06/11/15 0001    Exercises   Exercises Shoulder   Modalities   Modalities Electrical Stimulation   Electrical Stimulation   Electrical Stimulation Location R shoulder   Electrical Stimulation Action IFC   Electrical Stimulation Parameters 1-10 Hz x15 min   Electrical Stimulation Goals Pain   Manual Therapy   Manual Therapy Passive ROM   Passive ROM R shoulder into flexion with gentle holds at end range with oscillations for relaxation                  PT Short Term Goals - 06/06/15 1249    PT SHORT TERM GOAL #1   Title Pt to verbalize understanding of protecting healing  tissues to prevent reinjury.   Time 2   Period Weeks   Status New   PT SHORT TERM GOAL #2   Title I with HEP   Time 4   Period Weeks   Status New   PT SHORT TERM GOAL #3   Title decrease pain by 50%   Time 4   Period Weeks   Status New           PT Long Term Goals - 06/06/15 1255    PT LONG TERM GOAL #1   Title I with advanced HEP   Time 8   Period Weeks   Status New   PT LONG TERM GOAL #2   Title improve Rt shoulder flex/abd to 150 or greater to perform ADLS   Time 8   Period Weeks   Status New   PT LONG TERM GOAL #3   Title Improve right shoulder ER to 70 degrees or greater and IR within functional limits   Time 8   Period Weeks   Status New   PT LONG TERM GOAL #4   Title demo shoulder strength WFL for ADLS   Time 8   Period Weeks   Status New   PT LONG TERM GOAL #5   Title report pain 2/10 or less in right shoulder   Time 8   Period  Weeks   Status New               Plan - 06/11/15 1427    Clinical Impression Statement Patient was 21 minutes late for appointment today and tolerated treatment well although experiences grabbing pain at less than 45 deg intermittantly. Firm end feels noted into R shoulder flexion PROM. Required gentle oscillations during PROM to increase relaxation of the RUE. Sling was not donned when patient arrived for therapy. Pain remained high during PROM of the R shoulder and modalites were initated. Normal modalities response noted following removal of the modalites although he started stimulation with 3 output, advanced to 5 output and 7 output over the time of the stimulation. Experienced 6/10 pain following treatment. Encouraged patient to ONLY complete exercises instructed by Dr. Ranell Patrick. Patient was educated to the fact that Dr. Ranell Patrick does not normally tell patients to begin AROM or AAROM exercsies this early after surgery but patient insisted that Dr. Ranell Patrick said to complete AROM ER and AAROM flexion. Patient was instructed that for therapy, clinicians would use normal RTC repair protocol for Dr. Ranell Patrick patients.   Pt will benefit from skilled therapeutic intervention in order to improve on the following deficits Decreased range of motion;Decreased safety awareness;Impaired UE functional use;Pain;Decreased strength;Increased edema;Postural dysfunction   Rehab Potential Excellent   PT Frequency 3x / week   PT Duration 4 weeks   PT Treatment/Interventions ADLs/Self Care Home Management;Cryotherapy;Electrical Stimulation;Ultrasound;Patient/family education;Neuromuscular re-education;Therapeutic exercise;Manual techniques;Scar mobilization;Passive range of motion;Vasopneumatic Device   PT Next Visit Plan per RCR protocol, ROM, modalities PRN   Consulted and Agree with Plan of Care Patient        Problem List Patient Active Problem List   Diagnosis Date Noted  . TIA (transient ischemic attack)  12/15/2012  . Numbness of extremity 12/15/2012  . Tobacco abuse 05/19/2012  . Pneumonia 05/19/2012  . Kidney stones   . History of cervical fracture   . Hyperlipidemia   . HEARING LOSS, LEFT EAR 04/16/2009  . TESTICULAR MASS, LEFT 04/16/2009  . SLEEP APNEA 04/16/2009    Evelene Croon, PTA 06/11/2015, 2:50 PM  [facility] [place], [place], [address] Phone: [phone]   Fax:  [phone]

## 2015-06-14 ENCOUNTER — Encounter: Payer: Self-pay | Admitting: Physical Therapy

## 2015-06-14 ENCOUNTER — Ambulatory Visit: Payer: Worker's Compensation | Admitting: Physical Therapy

## 2015-06-14 DIAGNOSIS — R531 Weakness: Secondary | ICD-10-CM

## 2015-06-14 DIAGNOSIS — R609 Edema, unspecified: Secondary | ICD-10-CM

## 2015-06-14 DIAGNOSIS — M25611 Stiffness of right shoulder, not elsewhere classified: Secondary | ICD-10-CM | POA: Diagnosis not present

## 2015-06-14 DIAGNOSIS — M25511 Pain in right shoulder: Secondary | ICD-10-CM

## 2015-06-14 NOTE — Therapy (Signed)
[facility] [address] [place], [place], [address] Phone: [phone]   Fax:  [phone]  Physical Therapy Treatment  Patient Details  Name: Brett Glover MRN: [id number] Date of Birth: 09-10-1966 Referring Provider:  Bradd Canary, MD  Encounter Date: 06/14/2015      PT End of Session - 06/14/15 1437    Visit Number 3   Number of Visits 12   Date for PT Re-Evaluation 07/06/15   PT Start Time 1346   PT Stop Time 1435   PT Time Calculation (min) 49 min   Activity Tolerance Patient limited by pain;Patient tolerated treatment well   Behavior During Therapy [facility] for tasks assessed/performed      Past Medical History  Diagnosis Date  . Chicken pox as a child  . Broken jaw 1983  . Kidney stones 1990  . Tobacco abuse 05/19/2012  . History of cervical fracture   . Hyperlipidemia   . Cervical vertebral closed fracture   . Elevated cholesterol     Past Surgical History  Procedure Laterality Date  . Fusion in neck  2004  . Fatty tissue removed  2007    left abdominal wall, lipoma  . Kidney stones removed  1990  . Cervical fusion      There were no vitals filed for this visit.  Visit Diagnosis:  Stiffness of right shoulder joint  Weakness  Pain in right shoulder  Edema      Subjective Assessment - 06/14/15 1429    Subjective Reports that he has hardly slept the last few nights and almost took a pain pill last night. States that earlier he experienced piercing pain and pain over the posterior shoulder and AC joint.   Pertinent History see above   Patient Stated Goals get full motion and strength   Currently in Pain? Yes   Pain Score 5    Pain Location Shoulder   Pain Orientation Right   Pain Descriptors / Indicators Numbness   Pain Type Surgical pain   Pain Onset 1 to 4 weeks ago            [facility] PT Assessment - 06/14/15 0001    Assessment   Medical Diagnosis s/p Rt RC surgery   Onset Date/Surgical  Date 05/28/15   Hand Dominance Right   Next MD Visit 07/03/15   Precautions   Precautions Shoulder   Type of Shoulder Precautions RCR   Precaution Comments Pt reported that Dr. Ranell Patrick told him to do seated active ER (hitchhiker)   ROM / Strength   AROM / PROM / Strength PROM   PROM   Overall PROM  Deficits   PROM Assessment Site Shoulder   Right/Left Shoulder Right   Right Shoulder Flexion 107 Degrees                     [facility] Adult PT Treatment/Exercise - 06/14/15 0001    Modalities   Modalities Electrical Stimulation   Electrical Stimulation   Electrical Stimulation Location R shoulder   Electrical Stimulation Action IFC   Electrical Stimulation Parameters 1-10 Hz x15 min   Electrical Stimulation Goals Pain   Manual Therapy   Manual Therapy Passive ROM;Soft tissue mobilization   Soft tissue mobilization R shoulder incision mobilizations and STW to R shoulder musculature to decrease pain   Passive ROM R shoulder into flexion with gentle holds at end range with oscillations for relaxation  PT Short Term Goals - 06/06/15 1249    PT SHORT TERM GOAL #1   Title Pt to verbalize understanding of protecting healing tissues to prevent reinjury.   Time 2   Period Weeks   Status New   PT SHORT TERM GOAL #2   Title I with HEP   Time 4   Period Weeks   Status New   PT SHORT TERM GOAL #3   Title decrease pain by 50%   Time 4   Period Weeks   Status New           PT Long Term Goals - 06/06/15 1255    PT LONG TERM GOAL #1   Title I with advanced HEP   Time 8   Period Weeks   Status New   PT LONG TERM GOAL #2   Title improve Rt shoulder flex/abd to 150 or greater to perform ADLS   Time 8   Period Weeks   Status New   PT LONG TERM GOAL #3   Title Improve right shoulder ER to 70 degrees or greater and IR within functional limits   Time 8   Period Weeks   Status New   PT LONG TERM GOAL #4   Title demo shoulder strength WFL for  ADLS   Time 8   Period Weeks   Status New   PT LONG TERM GOAL #5   Title report pain 2/10 or less in right shoulder   Time 8   Period Weeks   Status New               Plan - 06/14/15 1444    Clinical Impression Statement Patient tolerated ROM treatment fairly well although he continues to have a pulsing pain while lowering R shoulder and he also reported experiencing a pain in posterior shoulder that was present prior to treatment. Also reported pain around the R [facility] joint which patient stated was cleaned during surgery 2.5 weeks ago. R shoulder sling was not donned when patient arrived to treatment. Normal modalites response noted following removal of the modalities and was able to tolerate 8 output on stimulation. Experienced 2/10 pain in R shouler following treatment and patient was encouragd to be careful with R shoulder activity and to take pain medications if he absolutely had to.   Pt will benefit from skilled therapeutic intervention in order to improve on the following deficits Decreased range of motion;Decreased safety awareness;Impaired UE functional use;Pain;Decreased strength;Increased edema;Postural dysfunction   Rehab Potential Excellent   PT Frequency 3x / week   PT Duration 4 weeks   PT Treatment/Interventions ADLs/Self Care Home Management;Cryotherapy;Electrical Stimulation;Ultrasound;Patient/family education;Neuromuscular re-education;Therapeutic exercise;Manual techniques;Scar mobilization;Passive range of motion;Vasopneumatic Device   PT Next Visit Plan per RCR protocol, ROM, modalities PRN   Consulted and Agree with Plan of Care Patient        Problem List Patient Active Problem List   Diagnosis Date Noted  . TIA (transient ischemic attack) 12/15/2012  . Numbness of extremity 12/15/2012  . Tobacco abuse 05/19/2012  . Pneumonia 05/19/2012  . Kidney stones   . History of cervical fracture   . Hyperlipidemia   . HEARING LOSS, LEFT EAR 04/16/2009  .  TESTICULAR MASS, LEFT 04/16/2009  . SLEEP APNEA 04/16/2009    Evelene Croon, PTA 06/14/2015, 2:53 PM  [facility] [address] [place], [place], [address] Phone: [phone]   Fax:  [phone]

## 2015-06-18 ENCOUNTER — Encounter: Payer: Self-pay | Admitting: Physical Therapy

## 2015-06-18 ENCOUNTER — Ambulatory Visit: Payer: Worker's Compensation | Admitting: Physical Therapy

## 2015-06-18 DIAGNOSIS — R531 Weakness: Secondary | ICD-10-CM

## 2015-06-18 DIAGNOSIS — R609 Edema, unspecified: Secondary | ICD-10-CM

## 2015-06-18 DIAGNOSIS — M25511 Pain in right shoulder: Secondary | ICD-10-CM

## 2015-06-18 DIAGNOSIS — M25611 Stiffness of right shoulder, not elsewhere classified: Secondary | ICD-10-CM

## 2015-06-18 NOTE — Therapy (Signed)
[facility] [address] [place], [place], [address] Phone: [phone]   Fax:  [phone]  Physical Therapy Treatment  Patient Details  Name: Brett Glover MRN: [id number] Date of Birth: 1966-03-31 Referring Provider:  Bradd Canary, MD  Encounter Date: 06/18/2015      PT End of Session - 06/18/15 1420    Visit Number 4   Number of Visits 12   Date for PT Re-Evaluation 07/06/15   PT Start Time 1345   PT Stop Time 1431   PT Time Calculation (min) 46 min   Activity Tolerance Patient tolerated treatment well   Behavior During Therapy [facility] for tasks assessed/performed      Past Medical History  Diagnosis Date  . Chicken pox as a child  . Broken jaw 1983  . Kidney stones 1990  . Tobacco abuse 05/19/2012  . History of cervical fracture   . Hyperlipidemia   . Cervical vertebral closed fracture   . Elevated cholesterol     Past Surgical History  Procedure Laterality Date  . Fusion in neck  2004  . Fatty tissue removed  2007    left abdominal wall, lipoma  . Kidney stones removed  1990  . Cervical fusion      There were no vitals filed for this visit.  Visit Diagnosis:  Stiffness of right shoulder joint  Weakness  Pain in right shoulder  Edema      Subjective Assessment - 06/18/15 1418    Subjective Reports that he started an anti-inflammatory/pain medication that was approved by [employer] and can tell a big difference since beginning the medication. Reports that he lifts his R arm into horizontal abduction to sleep at night.   Pertinent History see above   Patient Stated Goals get full motion and strength   Currently in Pain? Yes   Pain Score 1    Pain Location Shoulder   Pain Orientation Right   Pain Descriptors / Indicators Sore   Pain Type Surgical pain   Pain Onset 1 to 4 weeks ago            [facility] PT Assessment - 06/18/15 0001    Assessment   Medical Diagnosis s/p Rt RC surgery   Onset  Date/Surgical Date 05/28/15   Hand Dominance Right   Next MD Visit 07/03/15   Precautions   Precautions Shoulder   Type of Shoulder Precautions RCR   Precaution Comments Pt reported that Dr. Ranell Patrick told him to do seated active ER (hitchhiker)                     [facility] Adult PT Treatment/Exercise - 06/18/15 0001    Modalities   Modalities Electrical Stimulation   Electrical Stimulation   Electrical Stimulation Location R shoulder   Electrical Stimulation Action IFC   Electrical Stimulation Parameters 1-10 Hz x15 min   Electrical Stimulation Goals Pain   Manual Therapy   Manual Therapy Passive ROM   Passive ROM R shoulder into flexion/ER/IR with gentle holds at end range with oscillations for relaxation                  PT Short Term Goals - 06/18/15 1429    PT SHORT TERM GOAL #1   Title Pt to verbalize understanding of protecting healing tissues to prevent reinjury.   Time 2   Period Weeks   Status On-going   PT SHORT TERM GOAL #2   Title I with  HEP   Time 4   Period Weeks   Status On-going   PT SHORT TERM GOAL #3   Title decrease pain by 50%   Time 4   Period Weeks   Status On-going           PT Long Term Goals - 06/18/15 1429    PT LONG TERM GOAL #1   Title I with advanced HEP   Time 8   Period Weeks   Status On-going   PT LONG TERM GOAL #2   Title improve Rt shoulder flex/abd to 150 or greater to perform ADLS   Time 8   Period Weeks   Status On-going   PT LONG TERM GOAL #3   Title Improve right shoulder ER to 70 degrees or greater and IR within functional limits   Time 8   Period Weeks   Status On-going   PT LONG TERM GOAL #4   Title demo shoulder strength WFL for ADLS   Time 8   Period Weeks   Status On-going   PT LONG TERM GOAL #5   Title report pain 2/10 or less in right shoulder   Time 8   Period Weeks   Status On-going               Plan - 06/18/15 1424    Clinical Impression Statement Patient tolerated ROM  treatment fairly well although he continues to have a grinding sensation around the R [facility] joint felt during PROM/AAROM into flexion but is only felt by PTA intermittantly. Firm end feels were noted during PROM/AAROM of the R shoulder into flexion/ER/IR. Oscillations were continued to be utilized to encourage R shoulder relaxation. Patient was educated to continue to be careful with shoulder due to timeframe from surgery and shoulder continuing to be in a fragile healing state. Patient asked the timeframe for the PT and also when he could begin flexing R shoulder by himself and also about R shoulder horizontal abduction which patient was educated of Dr. Dietrich Pates preferences regarding protocol and horizontal abduction. While gentle R shoulder IR PROM/AAROM was being completed patient simulated like he wanted to arm wrestle but patient was told that arm wrestling was not in protocol. R shoulder sling was not donned when patient arrived in therapy gym. Normal modaliites response noted following removal of the modalities. Experienced 1/10 pain following treatment.    Pt will benefit from skilled therapeutic intervention in order to improve on the following deficits Decreased range of motion;Decreased safety awareness;Impaired UE functional use;Pain;Decreased strength;Increased edema;Postural dysfunction   Rehab Potential Excellent   PT Frequency 3x / week   PT Duration 4 weeks   PT Treatment/Interventions ADLs/Self Care Home Management;Cryotherapy;Electrical Stimulation;Ultrasound;Patient/family education;Neuromuscular re-education;Therapeutic exercise;Manual techniques;Scar mobilization;Passive range of motion;Vasopneumatic Device   PT Next Visit Plan per RCR protocol, ROM, modalities PRN   Consulted and Agree with Plan of Care Patient        Problem List Patient Active Problem List   Diagnosis Date Noted  . TIA (transient ischemic attack) 12/15/2012  . Numbness of extremity 12/15/2012  . Tobacco abuse  05/19/2012  . Pneumonia 05/19/2012  . Kidney stones   . History of cervical fracture   . Hyperlipidemia   . HEARING LOSS, LEFT EAR 04/16/2009  . TESTICULAR MASS, LEFT 04/16/2009  . SLEEP APNEA 04/16/2009    Evelene Croon, PTA 06/18/2015, 2:52 PM  [facility] [address] [place], [place], [address] Phone: [phone]   Fax:  [phone]

## 2015-06-21 ENCOUNTER — Ambulatory Visit: Payer: Worker's Compensation | Attending: Orthopedic Surgery | Admitting: Physical Therapy

## 2015-06-21 DIAGNOSIS — M25611 Stiffness of right shoulder, not elsewhere classified: Secondary | ICD-10-CM

## 2015-06-21 DIAGNOSIS — R609 Edema, unspecified: Secondary | ICD-10-CM

## 2015-06-21 DIAGNOSIS — R531 Weakness: Secondary | ICD-10-CM | POA: Diagnosis present

## 2015-06-21 DIAGNOSIS — M25511 Pain in right shoulder: Secondary | ICD-10-CM

## 2015-06-21 NOTE — Therapy (Signed)
[facility] [address] [place], [place], [address] Phone: [phone]   Fax:  [phone]  Physical Therapy Treatment  Patient Details  Name: Brett Glover MRN: [id number] Date of Birth: 27-Sep-1966 Referring Provider:  Bradd Canary, MD  Encounter Date: 06/21/2015      PT End of Session - 06/21/15 1349    Visit Number 5   Number of Visits 12   Date for PT Re-Evaluation 07/06/15   PT Start Time 1348   PT Stop Time 1442   PT Time Calculation (min) 54 min   Activity Tolerance Patient tolerated treatment well   Behavior During Therapy [facility] for tasks assessed/performed      Past Medical History  Diagnosis Date  . Chicken pox as a child  . Broken jaw 1983  . Kidney stones 1990  . Tobacco abuse 05/19/2012  . History of cervical fracture   . Hyperlipidemia   . Cervical vertebral closed fracture   . Elevated cholesterol     Past Surgical History  Procedure Laterality Date  . Fusion in neck  2004  . Fatty tissue removed  2007    left abdominal wall, lipoma  . Kidney stones removed  1990  . Cervical fusion      There were no vitals filed for this visit.  Visit Diagnosis:  Stiffness of right shoulder joint  Pain in right shoulder  Edema      Subjective Assessment - 06/21/15 1351    Subjective Patient reports his pain has increased since yesterday. Last night pain was 9/10 wakiing him up. Today he says it's a 7/10. He sleeps with arm supported on two pillows while supine.   Currently in Pain? Yes   Pain Score 7    Pain Location Shoulder   Pain Orientation Right   Pain Descriptors / Indicators Sharp   Pain Type Surgical pain   Pain Onset 1 to 4 weeks ago   Pain Frequency Intermittent   Aggravating Factors  sleeping   Pain Relieving Factors ice   Effect of Pain on Daily Activities can't do ADLs or work            [facility] PT Assessment - 06/21/15 0001    Assessment   Medical Diagnosis s/p Rt RC surgery   Onset Date/Surgical Date 05/28/15   Hand Dominance Right   Next MD Visit 07/03/15   Precautions   Precautions Shoulder   Type of Shoulder Precautions RCR   Precaution Comments Pt reported that Dr. Ranell Patrick told him to do seated active ER (hitchhiker)   ROM / Strength   AROM / PROM / Strength PROM   PROM   PROM Assessment Site Shoulder   Right/Left Shoulder Right   Right Shoulder Flexion 107 Degrees   Right Shoulder ABduction 90 Degrees   Right Shoulder External Rotation 37 Degrees                     [facility] Adult PT Treatment/Exercise - 06/21/15 0001    Self-Care   Self-Care Other Self-Care Comments   Other Self-Care Comments  educated patient's wife on gentle oscillations to help with pain. Patient inquired about playing his guitar. Advised against it maily due to new onset pain, but told patient if his wife could passively place his arm in postion and he only used his wrist to play, short periods might be okay.   Modalities   Modalities Press photographer  R shoulder   Electrical Stimulation Action IFC   Electrical Stimulation Parameters 80-150 hZ   Electrical Stimulation Goals Pain   Vasopneumatic   Number Minutes Vasopneumatic  15 minutes  Rt shoulder   Vasopnuematic Location  Shoulder   Vasopneumatic Pressure Medium   Vasopneumatic Temperature  3*   Manual Therapy   Manual Therapy Passive ROM   Passive ROM R shoulder flex, ABD and ER with oscillations to relax shoulder.                 PT Education - 06/21/15 1549    Education provided Yes   Education Details Self care - wife educated on oscillations; discussed guitar restrictions, see note.   Person(s) Educated Patient;Spouse   Methods Explanation;Demonstration   Comprehension Verbalized understanding          PT Short Term Goals - 06/18/15 1429    PT SHORT TERM GOAL #1   Title Pt to verbalize understanding of  protecting healing tissues to prevent reinjury.   Time 2   Period Weeks   Status On-going   PT SHORT TERM GOAL #2   Title I with HEP   Time 4   Period Weeks   Status On-going   PT SHORT TERM GOAL #3   Title decrease pain by 50%   Time 4   Period Weeks   Status On-going           PT Long Term Goals - 06/18/15 1429    PT LONG TERM GOAL #1   Title I with advanced HEP   Time 8   Period Weeks   Status On-going   PT LONG TERM GOAL #2   Title improve Rt shoulder flex/abd to 150 or greater to perform ADLS   Time 8   Period Weeks   Status On-going   PT LONG TERM GOAL #3   Title Improve right shoulder ER to 70 degrees or greater and IR within functional limits   Time 8   Period Weeks   Status On-going   PT LONG TERM GOAL #4   Title demo shoulder strength WFL for ADLS   Time 8   Period Weeks   Status On-going   PT LONG TERM GOAL #5   Title report pain 2/10 or less in right shoulder   Time 8   Period Weeks   Status On-going               Plan - 06/21/15 1551    Clinical Impression Statement Patient is progressing with PROM of R shoulder in flex, ABD and ER. He demonstrates what appears to be an anterior glide of the humeral head with passive return from flexion to side. Patient also reports pain with this motion. Another PT was asked to look at this as well. We have a call into Dr. Ranell Patrick' office to see surgical reports from both his RCR surgeries. Patient has had increased pain since yesterday particularly with sleeping. Advised patient to sleep in an inclined position and educated his wife on oscillatiions to help with pain.    PT Next Visit Plan per RCR protocol, ROM, modalities PRN; monitor anterior glide of humeral head.   Consulted and Agree with Plan of Care Patient        Problem List Patient Active Problem List   Diagnosis Date Noted  . TIA (transient ischemic attack) 12/15/2012  . Numbness of extremity 12/15/2012  . Tobacco abuse 05/19/2012  .  Pneumonia 05/19/2012  . Kidney stones   .  History of cervical fracture   . Hyperlipidemia   . HEARING LOSS, LEFT EAR 04/16/2009  . TESTICULAR MASS, LEFT 04/16/2009  . SLEEP APNEA 04/16/2009    Solon Palm PT  06/21/2015, 3:59 PM  [facility] [address] [place], [place], [address] Phone: [phone]   Fax:  [phone]

## 2015-06-26 ENCOUNTER — Ambulatory Visit: Payer: Worker's Compensation | Admitting: Physical Therapy

## 2015-06-26 DIAGNOSIS — M25611 Stiffness of right shoulder, not elsewhere classified: Secondary | ICD-10-CM

## 2015-06-26 DIAGNOSIS — M25511 Pain in right shoulder: Secondary | ICD-10-CM

## 2015-06-26 NOTE — Therapy (Signed)
[facility] [address] [place], [place], [address] Phone: [phone]   Fax:  [phone]  Physical Therapy Treatment  Patient Details  Name: Brett Glover MRN: [id number] Date of Birth: 27-Aug-1966 Referring Provider:  Bradd Canary, MD  Encounter Date: 06/26/2015      PT End of Session - 06/26/15 1346    Visit Number 6   Number of Visits 12   Date for PT Re-Evaluation 07/06/15   PT Start Time 1346   PT Stop Time 1429   PT Time Calculation (min) 43 min   Activity Tolerance Patient tolerated treatment well   Behavior During Therapy [facility] for tasks assessed/performed      Past Medical History  Diagnosis Date  . Chicken pox as a child  . Broken jaw 1983  . Kidney stones 1990  . Tobacco abuse 05/19/2012  . History of cervical fracture   . Hyperlipidemia   . Cervical vertebral closed fracture   . Elevated cholesterol     Past Surgical History  Procedure Laterality Date  . Fusion in neck  2004  . Fatty tissue removed  2007    left abdominal wall, lipoma  . Kidney stones removed  1990  . Cervical fusion      There were no vitals filed for this visit.  Visit Diagnosis:  Stiffness of right shoulder joint  Pain in right shoulder      Subjective Assessment - 06/26/15 1348    Subjective Patient reports no pain just soreness and a little numbness in anterior shoulder.    Currently in Pain? Yes   Pain Score 1    Pain Location Shoulder   Pain Orientation Right   Pain Descriptors / Indicators Sharp   Pain Type Surgical pain   Pain Onset 1 to 4 weeks ago   Pain Frequency Intermittent   Aggravating Factors  sleeping   Pain Relieving Factors ice   Effect of Pain on Daily Activities can't sleep                         [facility] Adult PT Treatment/Exercise - 06/26/15 0001    Manual Therapy   Manual Therapy Joint mobilization;Soft tissue mobilization;Myofascial release;Passive ROM   Joint Mobilization  gentle oscillations to relax shoulder; scapular mobs in supine for protraction   Soft tissue mobilization R lats   Myofascial Release TPR to R med scapular muscles and R lats with passive shoulder flexion in small range.   Passive ROM R shoulder flex, ABD and ER with oscillations to relax shoulder.                   PT Short Term Goals - 06/18/15 1429    PT SHORT TERM GOAL #1   Title Pt to verbalize understanding of protecting healing tissues to prevent reinjury.   Time 2   Period Weeks   Status On-going   PT SHORT TERM GOAL #2   Title I with HEP   Time 4   Period Weeks   Status On-going   PT SHORT TERM GOAL #3   Title decrease pain by 50%   Time 4   Period Weeks   Status On-going           PT Long Term Goals - 06/18/15 1429    PT LONG TERM GOAL #1   Title I with advanced HEP   Time 8   Period Weeks   Status On-going  PT LONG TERM GOAL #2   Title improve Rt shoulder flex/abd to 150 or greater to perform ADLS   Time 8   Period Weeks   Status On-going   PT LONG TERM GOAL #3   Title Improve right shoulder ER to 70 degrees or greater and IR within functional limits   Time 8   Period Weeks   Status On-going   PT LONG TERM GOAL #4   Title demo shoulder strength WFL for ADLS   Time 8   Period Weeks   Status On-going   PT LONG TERM GOAL #5   Title report pain 2/10 or less in right shoulder   Time 8   Period Weeks   Status On-going               Plan - 06/26/15 1451    Clinical Impression Statement Patient tolerated PROM better today with less pain in shoulder and minimal anterior glide of humeral head. He also denied pain today. He had active TPs in medial R scapular muscles and in right lats.    PT Next Visit Plan per RCR protocol, ROM, modalities PRN; monitor anterior glide of humeral head, continue STW to lats and medial scapula prn.   Consulted and Agree with Plan of Care Patient        Problem List Patient Active Problem List    Diagnosis Date Noted  . TIA (transient ischemic attack) 12/15/2012  . Numbness of extremity 12/15/2012  . Tobacco abuse 05/19/2012  . Pneumonia 05/19/2012  . Kidney stones   . History of cervical fracture   . Hyperlipidemia   . HEARING LOSS, LEFT EAR 04/16/2009  . TESTICULAR MASS, LEFT 04/16/2009  . SLEEP APNEA 04/16/2009    Solon Palm PT  06/26/2015, 3:32 PM  [facility] [address] [place], [place], [address] Phone: [phone]   Fax:  [phone]

## 2015-06-28 ENCOUNTER — Ambulatory Visit: Payer: Worker's Compensation | Admitting: Physical Therapy

## 2015-06-28 DIAGNOSIS — M25611 Stiffness of right shoulder, not elsewhere classified: Secondary | ICD-10-CM

## 2015-06-28 DIAGNOSIS — R609 Edema, unspecified: Secondary | ICD-10-CM

## 2015-06-28 DIAGNOSIS — R531 Weakness: Secondary | ICD-10-CM

## 2015-06-28 DIAGNOSIS — M25511 Pain in right shoulder: Secondary | ICD-10-CM

## 2015-06-28 NOTE — Therapy (Signed)
[facility] [address] [place], [place], [address] Phone: [phone]   Fax:  [phone]  Physical Therapy Treatment  Patient Details  Name: Brett Glover MRN: [id number] Date of Birth: 1966/07/18 Referring Provider:  Bradd Canary, MD  Encounter Date: 06/28/2015      PT End of Session - 06/28/15 1346    Visit Number 7   Number of Visits 12   Date for PT Re-Evaluation 07/06/15   PT Start Time 1345   PT Stop Time 1416   PT Time Calculation (min) 31 min   Activity Tolerance Patient tolerated treatment well   Behavior During Therapy [facility] for tasks assessed/performed      Past Medical History  Diagnosis Date  . Chicken pox as a child  . Broken jaw 1983  . Kidney stones 1990  . Tobacco abuse 05/19/2012  . History of cervical fracture   . Hyperlipidemia   . Cervical vertebral closed fracture   . Elevated cholesterol     Past Surgical History  Procedure Laterality Date  . Fusion in neck  2004  . Fatty tissue removed  2007    left abdominal wall, lipoma  . Kidney stones removed  1990  . Cervical fusion      There were no vitals filed for this visit.  Visit Diagnosis:  Stiffness of right shoulder joint  Pain in right shoulder  Edema  Weakness      Subjective Assessment - 06/28/15 1346    Subjective Reports more numb feeling than pain in R shoulder.   Pertinent History see above   Patient Stated Goals get full motion and strength   Currently in Pain? Yes   Pain Score 1    Pain Location Shoulder   Pain Orientation Right   Pain Descriptors / Indicators Numbness   Pain Type Surgical pain   Pain Onset 1 to 4 weeks ago   Pain Frequency Constant            [facility] PT Assessment - 06/28/15 0001    Assessment   Medical Diagnosis s/p Rt RC surgery   Onset Date/Surgical Date 05/28/15   Hand Dominance Right   Next MD Visit 07/03/15   Precautions   Precautions Shoulder   Type of Shoulder Precautions RCR   Precaution Comments Pt reported that Dr. Ranell Patrick told him to do seated active ER (hitchhiker)                     [facility] Adult PT Treatment/Exercise - 06/28/15 0001    Manual Therapy   Passive ROM R shoulder PROM into flex/ER with gentle oscillations to relax R shoulder                  PT Short Term Goals - 06/18/15 1429    PT SHORT TERM GOAL #1   Title Pt to verbalize understanding of protecting healing tissues to prevent reinjury.   Time 2   Period Weeks   Status On-going   PT SHORT TERM GOAL #2   Title I with HEP   Time 4   Period Weeks   Status On-going   PT SHORT TERM GOAL #3   Title decrease pain by 50%   Time 4   Period Weeks   Status On-going           PT Long Term Goals - 06/18/15 1429    PT LONG TERM GOAL #1   Title I with advanced HEP  Time 8   Period Weeks   Status On-going   PT LONG TERM GOAL #2   Title improve Rt shoulder flex/abd to 150 or greater to perform ADLS   Time 8   Period Weeks   Status On-going   PT LONG TERM GOAL #3   Title Improve right shoulder ER to 70 degrees or greater and IR within functional limits   Time 8   Period Weeks   Status On-going   PT LONG TERM GOAL #4   Title demo shoulder strength WFL for ADLS   Time 8   Period Weeks   Status On-going   PT LONG TERM GOAL #5   Title report pain 2/10 or less in right shoulder   Time 8   Period Weeks   Status On-going               Plan - 06/28/15 1421    Clinical Impression Statement Patient tolerated treatment fairly well with PROM. Firm end feels were noted during PROM into flexion/ER and had pain primarily during lowering of R arm although he had some facial grimacing intermittantly at end range. Denied any modalities during today's treatment. Continued to report experiencing 1/10 pain following treatment.   Pt will benefit from skilled therapeutic intervention in order to improve on the following deficits Decreased range of motion;Decreased  safety awareness;Impaired UE functional use;Pain;Decreased strength;Increased edema;Postural dysfunction   Rehab Potential Excellent   PT Frequency 3x / week   PT Duration 4 weeks   PT Treatment/Interventions ADLs/Self Care Home Management;Cryotherapy;Electrical Stimulation;Ultrasound;Patient/family education;Neuromuscular re-education;Therapeutic exercise;Manual techniques;Scar mobilization;Passive range of motion;Vasopneumatic Device   PT Next Visit Plan per RCR protocol, ROM, modalities PRN; monitor anterior glide of humeral head, continue STW to lats and medial scapula prn.   Consulted and Agree with Plan of Care Patient        Problem List Patient Active Problem List   Diagnosis Date Noted  . TIA (transient ischemic attack) 12/15/2012  . Numbness of extremity 12/15/2012  . Tobacco abuse 05/19/2012  . Pneumonia 05/19/2012  . Kidney stones   . History of cervical fracture   . Hyperlipidemia   . HEARING LOSS, LEFT EAR 04/16/2009  . TESTICULAR MASS, LEFT 04/16/2009  . SLEEP APNEA 04/16/2009    Evelene Croon, PTA 06/28/2015, 2:24 PM  [facility] [address] [place], [place], [address] Phone: [phone]   Fax:  [phone]

## 2015-07-02 ENCOUNTER — Ambulatory Visit: Payer: Worker's Compensation | Admitting: Physical Therapy

## 2015-07-02 ENCOUNTER — Encounter: Payer: Self-pay | Admitting: Physical Therapy

## 2015-07-02 DIAGNOSIS — M25611 Stiffness of right shoulder, not elsewhere classified: Secondary | ICD-10-CM

## 2015-07-02 DIAGNOSIS — R531 Weakness: Secondary | ICD-10-CM

## 2015-07-02 DIAGNOSIS — M25511 Pain in right shoulder: Secondary | ICD-10-CM

## 2015-07-02 DIAGNOSIS — R609 Edema, unspecified: Secondary | ICD-10-CM

## 2015-07-02 NOTE — Therapy (Signed)
[facility] [address] [place], [place], [address] Phone: [phone]   Fax:  [phone]  Physical Therapy Treatment  Patient Details  Name: Brett Glover MRN: [id number] Date of Birth: 09-06-1966 Referring Provider:  Bradd Canary, MD  Encounter Date: 07/02/2015      PT End of Session - 07/02/15 1348    Visit Number 8   Number of Visits 12   Date for PT Re-Evaluation 07/06/15   PT Start Time 1346   PT Stop Time 1418   PT Time Calculation (min) 32 min      Past Medical History  Diagnosis Date  . Chicken pox as a child  . Broken jaw 1983  . Kidney stones 1990  . Tobacco abuse 05/19/2012  . History of cervical fracture   . Hyperlipidemia   . Cervical vertebral closed fracture   . Elevated cholesterol     Past Surgical History  Procedure Laterality Date  . Fusion in neck  2004  . Fatty tissue removed  2007    left abdominal wall, lipoma  . Kidney stones removed  1990  . Cervical fusion      There were no vitals filed for this visit.  Visit Diagnosis:  Stiffness of right shoulder joint  Pain in right shoulder  Edema  Weakness      Subjective Assessment - 07/02/15 1347    Subjective Has MD appt tomorrow with Dr. Ranell Patrick. Reports that R shoulder feels the same today as in previous appt. Reports that pain rating below is not pain feeling but irritability/prickling feeling such as after having ice applied for a while. Reports that he played his guitar recently.   Pertinent History see above   Patient Stated Goals get full motion and strength   Currently in Pain? Yes   Pain Score 8    Pain Location Shoulder   Pain Orientation Right   Pain Descriptors / Indicators Other (Comment)  "irritable" "prickly"   Pain Type Surgical pain   Pain Onset 1 to 4 weeks ago            [facility] PT Assessment - 07/02/15 0001    Assessment   Medical Diagnosis s/p Rt RC surgery   Onset Date/Surgical Date 05/28/15   Hand  Dominance Right   Next MD Visit 07/03/15   Precautions   Precautions Shoulder   Type of Shoulder Precautions RCR   Precaution Comments Pt reported that Dr. Ranell Patrick told him to do seated active ER (hitchhiker)   ROM / Strength   AROM / PROM / Strength PROM   PROM   PROM Assessment Site Shoulder   Right/Left Shoulder Right   Right Shoulder Flexion 125 Degrees   Right Shoulder Internal Rotation 63 Degrees   Right Shoulder External Rotation 30 Degrees                     [facility] Adult PT Treatment/Exercise - 07/02/15 0001    Manual Therapy   Manual Therapy Passive ROM   Passive ROM R shoulder PROM into flex/ER/IR with gentle oscillations to relax R shoulder                  PT Short Term Goals - 06/18/15 1429    PT SHORT TERM GOAL #1   Title Pt to verbalize understanding of protecting healing tissues to prevent reinjury.   Time 2   Period Weeks   Status On-going   PT SHORT TERM GOAL #2  Title I with HEP   Time 4   Period Weeks   Status On-going   PT SHORT TERM GOAL #3   Title decrease pain by 50%   Time 4   Period Weeks   Status On-going           PT Long Term Goals - 06/18/15 1429    PT LONG TERM GOAL #1   Title I with advanced HEP   Time 8   Period Weeks   Status On-going   PT LONG TERM GOAL #2   Title improve Rt shoulder flex/abd to 150 or greater to perform ADLS   Time 8   Period Weeks   Status On-going   PT LONG TERM GOAL #3   Title Improve right shoulder ER to 70 degrees or greater and IR within functional limits   Time 8   Period Weeks   Status On-going   PT LONG TERM GOAL #4   Title demo shoulder strength WFL for ADLS   Time 8   Period Weeks   Status On-going   PT LONG TERM GOAL #5   Title report pain 2/10 or less in right shoulder   Time 8   Period Weeks   Status On-going               Plan - 07/02/15 1422    Clinical Impression Statement Patient tolerated treatment fairly well although reported R shoulder  irritability prior to treatment and intermittant popping in [facility] joint region during P/AAROM of R shoulder. Patient reported that if he allowed RUE to completely relax that the popping did not occur and did not occur if he applied overpressure where the popping occured. Firm end feels noted during P/AAROM of the R shoulder in flexion/ER/IR. R shoulder ROM measurements were taken today in supine: flex 125 deg, IR 63 deg, ER 30 deg which was 37 deg 06/06/2015. Denied any modalities today in clinic. Experienced 5/10 "irritability/prickling" feeling following treatment,   Pt will benefit from skilled therapeutic intervention in order to improve on the following deficits Decreased range of motion;Decreased safety awareness;Impaired UE functional use;Pain;Decreased strength;Increased edema;Postural dysfunction   Rehab Potential Excellent   PT Frequency 3x / week   PT Duration 4 weeks   PT Treatment/Interventions ADLs/Self Care Home Management;Cryotherapy;Electrical Stimulation;Ultrasound;Patient/family education;Neuromuscular re-education;Therapeutic exercise;Manual techniques;Scar mobilization;Passive range of motion;Vasopneumatic Device   PT Next Visit Plan per RCR protocol, ROM, modalities PRN; monitor anterior glide of humeral head, continue STW to lats and medial scapula prn.   Consulted and Agree with Plan of Care Patient        Problem List Patient Active Problem List   Diagnosis Date Noted  . TIA (transient ischemic attack) 12/15/2012  . Numbness of extremity 12/15/2012  . Tobacco abuse 05/19/2012  . Pneumonia 05/19/2012  . Kidney stones   . History of cervical fracture   . Hyperlipidemia   . HEARING LOSS, LEFT EAR 04/16/2009  . TESTICULAR MASS, LEFT 04/16/2009  . SLEEP APNEA 04/16/2009    Florence Canner, PTA 07/02/2015 4:51 PM  [place] MPT [facility] [address] [place], [place], [address] Phone: [phone]   Fax:   [phone]

## 2015-07-05 ENCOUNTER — Ambulatory Visit: Payer: Worker's Compensation | Admitting: Physical Therapy

## 2015-07-05 DIAGNOSIS — R531 Weakness: Secondary | ICD-10-CM

## 2015-07-05 DIAGNOSIS — M25511 Pain in right shoulder: Secondary | ICD-10-CM

## 2015-07-05 DIAGNOSIS — M25611 Stiffness of right shoulder, not elsewhere classified: Secondary | ICD-10-CM

## 2015-07-05 DIAGNOSIS — R609 Edema, unspecified: Secondary | ICD-10-CM

## 2015-07-05 NOTE — Therapy (Signed)
[facility] [address] [place], [place], [address] Phone: [phone]   Fax:  [phone]  Physical Therapy Treatment  Patient Details  Name: Brett Glover MRN: [id number] Date of Birth: 1965-12-25 Referring Provider:  Bradd Canary, MD  Encounter Date: 07/05/2015      PT End of Session - 07/05/15 1346    Visit Number 9   Number of Visits 12   Date for PT Re-Evaluation 07/06/15   PT Start Time 1346      Past Medical History  Diagnosis Date  . Chicken pox as a child  . Broken jaw 1983  . Kidney stones 1990  . Tobacco abuse 05/19/2012  . History of cervical fracture   . Hyperlipidemia   . Cervical vertebral closed fracture   . Elevated cholesterol     Past Surgical History  Procedure Laterality Date  . Fusion in neck  2004  . Fatty tissue removed  2007    left abdominal wall, lipoma  . Kidney stones removed  1990  . Cervical fusion      There were no vitals filed for this visit.  Visit Diagnosis:  Stiffness of right shoulder joint  Pain in right shoulder  Edema  Weakness      Subjective Assessment - 07/05/15 1347    Subjective (p) Reports MD is overall pleased with progress. MD said to begin IR per patient report. Patient states that Dr. Ranell Patrick states the popping is normal for the surgery he had doen.   Pertinent History (p) see above   Patient Stated Goals (p) get full motion and strength   Currently in Pain? (p) Yes   Pain Score (p) 7    Pain Location (p) Shoulder   Pain Orientation (p) Right   Pain Descriptors / Indicators (p) Discomfort   Pain Type (p) Surgical pain   Pain Onset (p) 1 to 4 weeks ago            [facility] PT Assessment - 07/05/15 0001    Assessment   Medical Diagnosis s/p Rt RC surgery   Onset Date/Surgical Date 05/28/15   Hand Dominance Right   Next MD Visit 08/07/2015   Precautions   Precautions Shoulder   Type of Shoulder Precautions RCR   Precaution Comments Pt reported  that Dr. Ranell Patrick told him to do seated active ER (hitchhiker)                     [facility] Adult PT Treatment/Exercise - 07/05/15 0001    Modalities   Modalities Electrical Stimulation   Electrical Stimulation   Electrical Stimulation Location R shoulder   Electrical Stimulation Action IFC   Electrical Stimulation Parameters 1-10 Hz x10 min   Electrical Stimulation Goals Pain   Manual Therapy   Manual Therapy Passive ROM   Passive ROM R shoulder PROM into flex/scap/ER/IR with gentle oscillations to relax R shoulder                  PT Short Term Goals - 06/18/15 1429    PT SHORT TERM GOAL #1   Title Pt to verbalize understanding of protecting healing tissues to prevent reinjury.   Time 2   Period Weeks   Status On-going   PT SHORT TERM GOAL #2   Title I with HEP   Time 4   Period Weeks   Status On-going   PT SHORT TERM GOAL #3   Title decrease pain by 50%  Time 4   Period Weeks   Status On-going           PT Long Term Goals - 06/18/15 1429    PT LONG TERM GOAL #1   Title I with advanced HEP   Time 8   Period Weeks   Status On-going   PT LONG TERM GOAL #2   Title improve Rt shoulder flex/abd to 150 or greater to perform ADLS   Time 8   Period Weeks   Status On-going   PT LONG TERM GOAL #3   Title Improve right shoulder ER to 70 degrees or greater and IR within functional limits   Time 8   Period Weeks   Status On-going   PT LONG TERM GOAL #4   Title demo shoulder strength WFL for ADLS   Time 8   Period Weeks   Status On-going   PT LONG TERM GOAL #5   Title report pain 2/10 or less in right shoulder   Time 8   Period Weeks   Status On-going               Plan - 07/05/15 1426    Clinical Impression Statement Patient tolerated treatment well today and did not verbalize R shoulder popping as frequently as he has in previous treatments. Only reported R shoulder popping intermittantly during gentle R shoulder scaption. Firm end  feels noted during R shoulder PROM. Normal modalities response noted following removal of the modalities although he requested shorter stimulation time. Experienced 2/10 discomfort following treatment.   Pt will benefit from skilled therapeutic intervention in order to improve on the following deficits Decreased range of motion;Decreased safety awareness;Impaired UE functional use;Pain;Decreased strength;Increased edema;Postural dysfunction   Rehab Potential Excellent   PT Frequency 3x / week   PT Duration 4 weeks   PT Treatment/Interventions ADLs/Self Care Home Management;Cryotherapy;Electrical Stimulation;Ultrasound;Patient/family education;Neuromuscular re-education;Therapeutic exercise;Manual techniques;Scar mobilization;Passive range of motion;Vasopneumatic Device   PT Next Visit Plan per RCR protocol, ROM, modalities PRN; monitor anterior glide of humeral head, continue STW to lats and medial scapula prn.   Consulted and Agree with Plan of Care Patient        Problem List Patient Active Problem List   Diagnosis Date Noted  . TIA (transient ischemic attack) 12/15/2012  . Numbness of extremity 12/15/2012  . Tobacco abuse 05/19/2012  . Pneumonia 05/19/2012  . Kidney stones   . History of cervical fracture   . Hyperlipidemia   . HEARING LOSS, LEFT EAR 04/16/2009  . TESTICULAR MASS, LEFT 04/16/2009  . SLEEP APNEA 04/16/2009    Evelene Croon, PTA 07/05/2015, 2:32 PM  [facility] [address] [place], [place], [address] Phone: [phone]   Fax:  [phone]

## 2015-07-09 ENCOUNTER — Encounter: Payer: Self-pay | Admitting: Physical Therapy

## 2015-07-12 ENCOUNTER — Ambulatory Visit: Payer: Worker's Compensation | Admitting: Physical Therapy

## 2015-07-12 ENCOUNTER — Encounter: Payer: Self-pay | Admitting: Physical Therapy

## 2015-07-12 DIAGNOSIS — R609 Edema, unspecified: Secondary | ICD-10-CM

## 2015-07-12 DIAGNOSIS — R531 Weakness: Secondary | ICD-10-CM

## 2015-07-12 DIAGNOSIS — M25511 Pain in right shoulder: Secondary | ICD-10-CM

## 2015-07-12 DIAGNOSIS — M25611 Stiffness of right shoulder, not elsewhere classified: Secondary | ICD-10-CM

## 2015-07-12 NOTE — Therapy (Signed)
[facility] [address] [place], [place], [address] Phone: [phone]   Fax:  [phone]  Physical Therapy Treatment  Patient Details  Name: Brett Glover MRN: [id number] Date of Birth: 1966/04/23 Referring Provider:  Bradd Canary, MD  Encounter Date: 07/12/2015      PT End of Session - 07/12/15 1349    Visit Number 10   Number of Visits 20  New order by MD signed on 07/03/2015 for 2x/week for 4 weeks   Date for PT Re-Evaluation 07/06/15   PT Start Time 1348   PT Stop Time 1427   PT Time Calculation (min) 39 min   Activity Tolerance Patient tolerated treatment well   Behavior During Therapy [facility] for tasks assessed/performed      Past Medical History  Diagnosis Date  . Chicken pox as a child  . Broken jaw 1983  . Kidney stones 1990  . Tobacco abuse 05/19/2012  . History of cervical fracture   . Hyperlipidemia   . Cervical vertebral closed fracture   . Elevated cholesterol     Past Surgical History  Procedure Laterality Date  . Fusion in neck  2004  . Fatty tissue removed  2007    left abdominal wall, lipoma  . Kidney stones removed  1990  . Cervical fusion      There were no vitals filed for this visit.  Visit Diagnosis:  Stiffness of right shoulder joint  Pain in right shoulder  Edema  Weakness      Subjective Assessment - 07/12/15 1348    Subjective Reports increased soreness today.   Patient Stated Goals get full motion and strength   Currently in Pain? Yes   Pain Score 7    Pain Location Shoulder   Pain Orientation Right   Pain Descriptors / Indicators Sore   Pain Type Surgical pain   Pain Onset 1 to 4 weeks ago   Pain Frequency Constant            [facility] PT Assessment - 07/12/15 0001    Assessment   Medical Diagnosis s/p Rt RC surgery   Onset Date/Surgical Date 05/28/15   Hand Dominance Right   Next MD Visit 08/07/2015   Precautions   Precautions Shoulder   Type of Shoulder  Precautions RCR   Precaution Comments Pt reported that Dr. Ranell Patrick told him to do seated active ER (hitchhiker)                     [facility] Adult PT Treatment/Exercise - 07/12/15 0001    Modalities   Modalities Electrical Stimulation   Electrical Stimulation   Electrical Stimulation Location R shoulder   Electrical Stimulation Action IFC   Electrical Stimulation Parameters 1-10 Hz x10 min   Electrical Stimulation Goals Pain   Manual Therapy   Manual Therapy Passive ROM   Passive ROM R shoulder PROM into flex/ER/IR with gentle oscillations to relax R shoulder                  PT Short Term Goals - 06/18/15 1429    PT SHORT TERM GOAL #1   Title Pt to verbalize understanding of protecting healing tissues to prevent reinjury.   Time 2   Period Weeks   Status On-going   PT SHORT TERM GOAL #2   Title I with HEP   Time 4   Period Weeks   Status On-going   PT SHORT TERM GOAL #3   Title  decrease pain by 50%   Time 4   Period Weeks   Status On-going           PT Long Term Goals - 06/18/15 1429    PT LONG TERM GOAL #1   Title I with advanced HEP   Time 8   Period Weeks   Status On-going   PT LONG TERM GOAL #2   Title improve Rt shoulder flex/abd to 150 or greater to perform ADLS   Time 8   Period Weeks   Status On-going   PT LONG TERM GOAL #3   Title Improve right shoulder ER to 70 degrees or greater and IR within functional limits   Time 8   Period Weeks   Status On-going   PT LONG TERM GOAL #4   Title demo shoulder strength WFL for ADLS   Time 8   Period Weeks   Status On-going   PT LONG TERM GOAL #5   Title report pain 2/10 or less in right shoulder   Time 8   Period Weeks   Status On-going               Plan - 07/12/15 1420    Clinical Impression Statement Patient tolerated conservative treatment fairly well today although he had increased soreness today in R shoulder. Only indicates discomfort at end range of R shoulder into  flexion as well as during ER but does not grimace during ER and only grimaces intermittantly during flexion. Patient indicated the area of increased pain as medial to R anterior incision as well as around the R bicipital groove. Goals remain on-going secondary to pain and protocol limitations. Normal modaliites response noted following removal of the modalities although he requested only 10 minutes. Experienced 2/10 pain following treatment.   Pt will benefit from skilled therapeutic intervention in order to improve on the following deficits Decreased range of motion;Decreased safety awareness;Impaired UE functional use;Pain;Decreased strength;Increased edema;Postural dysfunction   Rehab Potential Excellent   PT Frequency 3x / week   PT Duration 4 weeks   PT Treatment/Interventions ADLs/Self Care Home Management;Cryotherapy;Electrical Stimulation;Ultrasound;Patient/family education;Neuromuscular re-education;Therapeutic exercise;Manual techniques;Scar mobilization;Passive range of motion;Vasopneumatic Device   PT Next Visit Plan Progress to [facility] of R shoulder per MD order and MPT POC.   Consulted and Agree with Plan of Care Patient        Problem List Patient Active Problem List   Diagnosis Date Noted  . TIA (transient ischemic attack) 12/15/2012  . Numbness of extremity 12/15/2012  . Tobacco abuse 05/19/2012  . Pneumonia 05/19/2012  . Kidney stones   . History of cervical fracture   . Hyperlipidemia   . HEARING LOSS, LEFT EAR 04/16/2009  . TESTICULAR MASS, LEFT 04/16/2009  . SLEEP APNEA 04/16/2009    Evelene Croon, PTA 07/12/2015, 2:29 PM  [facility] [address] [place], [place], [address] Phone: [phone]   Fax:  [phone]

## 2015-07-16 ENCOUNTER — Ambulatory Visit: Payer: Worker's Compensation | Admitting: Physical Therapy

## 2015-07-16 ENCOUNTER — Encounter: Payer: Self-pay | Admitting: Physical Therapy

## 2015-07-16 DIAGNOSIS — M25611 Stiffness of right shoulder, not elsewhere classified: Secondary | ICD-10-CM | POA: Diagnosis not present

## 2015-07-16 DIAGNOSIS — R609 Edema, unspecified: Secondary | ICD-10-CM

## 2015-07-16 DIAGNOSIS — M25511 Pain in right shoulder: Secondary | ICD-10-CM

## 2015-07-16 DIAGNOSIS — R531 Weakness: Secondary | ICD-10-CM

## 2015-07-16 NOTE — Therapy (Signed)
[facility] [address] [place], [place], [address] Phone: [phone]   Fax:  [phone]  Physical Therapy Treatment  Patient Details  Name: Brett Glover MRN: [id number] Date of Birth: 11/04/1965 Referring Provider:  Bradd Canary, MD  Encounter Date: 07/16/2015      PT End of Session - 07/16/15 1344    Visit Number 11   Number of Visits 20   Date for PT Re-Evaluation 08/17/15   PT Start Time 1341   PT Stop Time 1430   PT Time Calculation (min) 49 min   Activity Tolerance Patient tolerated treatment well   Behavior During Therapy [facility] for tasks assessed/performed      Past Medical History  Diagnosis Date  . Chicken pox as a child  . Broken jaw 1983  . Kidney stones 1990  . Tobacco abuse 05/19/2012  . History of cervical fracture   . Hyperlipidemia   . Cervical vertebral closed fracture   . Elevated cholesterol     Past Surgical History  Procedure Laterality Date  . Fusion in neck  2004  . Fatty tissue removed  2007    left abdominal wall, lipoma  . Kidney stones removed  1990  . Cervical fusion      There were no vitals filed for this visit.  Visit Diagnosis:  Stiffness of right shoulder joint  Pain in right shoulder  Edema  Weakness      Subjective Assessment - 07/16/15 1342    Subjective No new complaints today and no pain. Reports playing his guitar. Reports no real pain anymore just discomfort. Reports taking MD's word about popping and grinding as normal healing process.   Pertinent History see above   Patient Stated Goals get full motion and strength   Currently in Pain? Yes   Pain Score 3    Pain Location Shoulder   Pain Orientation Right   Pain Descriptors / Indicators Discomfort   Pain Type Surgical pain   Pain Onset 1 to 4 weeks ago            [facility] PT Assessment - 07/16/15 0001    Assessment   Medical Diagnosis s/p Rt RC surgery   Onset Date/Surgical Date 05/28/15   Hand  Dominance Right   Next MD Visit 08/07/2015   Precautions   Precautions Shoulder   Type of Shoulder Precautions RCR   Precaution Comments Pt reported that Dr. Ranell Patrick told him to do seated active ER (hitchhiker)                     [facility] Adult PT Treatment/Exercise - 07/16/15 0001    Shoulder Exercises: Supine   Protraction AAROM;Limitations  x25 reps   Flexion AAROM;Other (comment)  x30 reps   Shoulder Exercises: Seated   External Rotation AAROM;Other (comment)  x30 reps   Shoulder Exercises: Standing   Other Standing Exercises RUE wall ladder x3 min   Shoulder Exercises: Pulleys   Flexion Other (comment)  x5 min   Other Pulley Exercises UE Ranger flex/circles CW and CCW x20 reps each   Manual Therapy   Manual Therapy Passive ROM   Passive ROM R shoulder PROM into flex/scap/ER/IR with gentle oscillations to relax R shoulder                  PT Short Term Goals - 07/16/15 1352    PT SHORT TERM GOAL #1   Title Pt to verbalize understanding of protecting healing tissues  to prevent reinjury.   Time 2   Period Weeks   Status On-going   PT SHORT TERM GOAL #2   Title I with HEP   Time 4   Period Weeks   Status On-going   PT SHORT TERM GOAL #3   Title decrease pain by 50%   Time 4   Period Weeks   Status Achieved           PT Long Term Goals - 06/18/15 1429    PT LONG TERM GOAL #1   Title I with advanced HEP   Time 8   Period Weeks   Status On-going   PT LONG TERM GOAL #2   Title improve Rt shoulder flex/abd to 150 or greater to perform ADLS   Time 8   Period Weeks   Status On-going   PT LONG TERM GOAL #3   Title Improve right shoulder ER to 70 degrees or greater and IR within functional limits   Time 8   Period Weeks   Status On-going   PT LONG TERM GOAL #4   Title demo shoulder strength WFL for ADLS   Time 8   Period Weeks   Status On-going   PT LONG TERM GOAL #5   Title report pain 2/10 or less in right shoulder   Time 8    Period Weeks   Status On-going               Plan - 07/16/15 1437    Clinical Impression Statement Patient tolerated treatment fairly well today with only complaint of popping duirng RUE wall ladder and AAROM flexion. Also reported 6/10 R shoulder with AAROM chest press in supine today. Patient was educated to allow LUE to do most of the work with [employer] activities and to go to pain but not past the point of pain. Experienced intermittant pop/grinding sensation during PROM of R shoulder into scaption although patient reported lessening of popping and grinding as PROM progressed. Patient stated not to stop scaption because of grinding/popping. Denied any modalities during today's treatment.  Experienced 3/10 discomfort followng today's treatment.   Pt will benefit from skilled therapeutic intervention in order to improve on the following deficits Decreased range of motion;Decreased safety awareness;Impaired UE functional use;Pain;Decreased strength;Increased edema;Postural dysfunction   Rehab Potential Excellent   PT Frequency 3x / week   PT Duration 4 weeks   PT Treatment/Interventions ADLs/Self Care Home Management;Cryotherapy;Electrical Stimulation;Ultrasound;Patient/family education;Neuromuscular re-education;Therapeutic exercise;Manual techniques;Scar mobilization;Passive range of motion;Vasopneumatic Device   PT Next Visit Plan Progress to [facility] of R shoulder per MD order and MPT POC.  Please view new order under Media tab.   Consulted and Agree with Plan of Care Patient        Problem List Patient Active Problem List   Diagnosis Date Noted  . TIA (transient ischemic attack) 12/15/2012  . Numbness of extremity 12/15/2012  . Tobacco abuse 05/19/2012  . Pneumonia 05/19/2012  . Kidney stones   . History of cervical fracture   . Hyperlipidemia   . HEARING LOSS, LEFT EAR 04/16/2009  . TESTICULAR MASS, LEFT 04/16/2009  . SLEEP APNEA 04/16/2009    Evelene Croon,  PTA 07/16/2015, 2:42 PM  [facility] [address] [place], [place], [address] Phone: [phone]   Fax:  [phone]

## 2015-07-19 ENCOUNTER — Encounter: Payer: Self-pay | Admitting: Physical Therapy

## 2015-07-19 ENCOUNTER — Ambulatory Visit: Payer: Worker's Compensation | Admitting: Physical Therapy

## 2015-07-19 DIAGNOSIS — M25511 Pain in right shoulder: Secondary | ICD-10-CM

## 2015-07-19 DIAGNOSIS — M25611 Stiffness of right shoulder, not elsewhere classified: Secondary | ICD-10-CM | POA: Diagnosis not present

## 2015-07-19 DIAGNOSIS — R609 Edema, unspecified: Secondary | ICD-10-CM

## 2015-07-19 DIAGNOSIS — R531 Weakness: Secondary | ICD-10-CM

## 2015-07-19 NOTE — Therapy (Signed)
[facility] [address] [place], [place], [address] Phone: [phone]   Fax:  [phone]  Physical Therapy Treatment  Patient Details  Name: Brett Glover MRN: [id number] Date of Birth: December 31, 1965 Referring Provider:  Bradd Canary, MD  Encounter Date: 07/19/2015      PT End of Session - 07/19/15 1357    Visit Number 12   Number of Visits 20   Date for PT Re-Evaluation 08/17/15   PT Start Time 1353   PT Stop Time 1431   PT Time Calculation (min) 38 min   Activity Tolerance Patient tolerated treatment well   Behavior During Therapy [facility] for tasks assessed/performed      Past Medical History  Diagnosis Date  . Chicken pox as a child  . Broken jaw 1983  . Kidney stones 1990  . Tobacco abuse 05/19/2012  . History of cervical fracture   . Hyperlipidemia   . Cervical vertebral closed fracture   . Elevated cholesterol     Past Surgical History  Procedure Laterality Date  . Fusion in neck  2004  . Fatty tissue removed  2007    left abdominal wall, lipoma  . Kidney stones removed  1990  . Cervical fusion      There were no vitals filed for this visit.  Visit Diagnosis:  Stiffness of right shoulder joint  Pain in right shoulder  Edema  Weakness      Subjective Assessment - 07/19/15 1356    Subjective Reports he slept well last night for the first time in a while. Reports he woke up and got a little stiff in R shoulder. Reports trying to lift R shoulder into flexion against gravity and reports discomfort but states that he is going to continue trying to raise shoulder against gravity because shoulder wants to start moving.   Pertinent History see above   Patient Stated Goals get full motion and strength   Currently in Pain? No/denies            [facility] PT Assessment - 07/19/15 0001    Assessment   Medical Diagnosis s/p Rt RC surgery   Onset Date/Surgical Date 05/28/15   Hand Dominance Right   Next MD Visit  08/07/2015   Precautions   Precautions Shoulder   Type of Shoulder Precautions RCR   Precaution Comments Pt reported that Dr. Ranell Patrick told him to do seated active ER (hitchhiker)                     [facility] Adult PT Treatment/Exercise - 07/19/15 0001    Shoulder Exercises: Supine   Protraction AAROM;Limitations  x30 reps   Flexion AAROM;Other (comment)  x30 reps   Shoulder Exercises: Seated   External Rotation AAROM;Other (comment)  x30 reps   Other Seated Exercises Ball rolls into flexion/ circles CW, CCW x20 reps   Shoulder Exercises: Standing   Other Standing Exercises RUE wall ladder x3 min   Shoulder Exercises: Pulleys   Flexion Other (comment)  x5 min   Other Pulley Exercises UE Ranger flex/circles CW and CCW x20 reps each   Manual Therapy   Manual Therapy Passive ROM   Passive ROM R shoulder PROM into flex/scap/ER with gentle holds at end range                PT Education - 07/19/15 1415    Education provided Yes   Education Details Educated patient of protocol limitations and not being at antigravity  stage yet in recovery and to be careful to prevent further injury.   Person(s) Educated Patient   Methods Explanation   Comprehension Verbalized understanding  Patient reports he is going to continue to try that motion because he said his shoulder wants to move.          PT Short Term Goals - 07/16/15 1352    PT SHORT TERM GOAL #1   Title Pt to verbalize understanding of protecting healing tissues to prevent reinjury.   Time 2   Period Weeks   Status On-going   PT SHORT TERM GOAL #2   Title I with HEP   Time 4   Period Weeks   Status On-going   PT SHORT TERM GOAL #3   Title decrease pain by 50%   Time 4   Period Weeks   Status Achieved           PT Long Term Goals - 06/18/15 1429    PT LONG TERM GOAL #1   Title I with advanced HEP   Time 8   Period Weeks   Status On-going   PT LONG TERM GOAL #2   Title improve Rt shoulder  flex/abd to 150 or greater to perform ADLS   Time 8   Period Weeks   Status On-going   PT LONG TERM GOAL #3   Title Improve right shoulder ER to 70 degrees or greater and IR within functional limits   Time 8   Period Weeks   Status On-going   PT LONG TERM GOAL #4   Title demo shoulder strength WFL for ADLS   Time 8   Period Weeks   Status On-going   PT LONG TERM GOAL #5   Title report pain 2/10 or less in right shoulder   Time 8   Period Weeks   Status On-going               Plan - 07/19/15 1433    Clinical Impression Statement Patient tolerated treatment fairly well today with complaints of discomfort and clicking with ball rolls and with AAROM flexion around the posterior R humerus. Patient was educated by PTA to refrain from trying any antigravity UE activitiy due to protocol limitiations although patient states that he is going to try and continue them. Firm end feels noted duirng PROM of the R shoulder in all directions wtih no complaints from patient regarding grinding or popping. During AAROM flexion PTA palpated R posterior humerus where patient indicated the grinding and only minimal grinding sensation was observed. Experienced 3/10 discomfort in R shoulder following today's treatment and patient denied any modalities.   Pt will benefit from skilled therapeutic intervention in order to improve on the following deficits Decreased range of motion;Decreased safety awareness;Impaired UE functional use;Pain;Decreased strength;Increased edema;Postural dysfunction   Rehab Potential Excellent   PT Frequency 3x / week   PT Duration 4 weeks   PT Treatment/Interventions ADLs/Self Care Home Management;Cryotherapy;Electrical Stimulation;Ultrasound;Patient/family education;Neuromuscular re-education;Therapeutic exercise;Manual techniques;Scar mobilization;Passive range of motion;Vasopneumatic Device   PT Next Visit Plan Progress to [facility] of R shoulder per MD order and MPT POC.  Please  view new order under Media tab.   Consulted and Agree with Plan of Care Patient        Problem List Patient Active Problem List   Diagnosis Date Noted  . TIA (transient ischemic attack) 12/15/2012  . Numbness of extremity 12/15/2012  . Tobacco abuse 05/19/2012  . Pneumonia 05/19/2012  . Kidney stones   .  History of cervical fracture   . Hyperlipidemia   . HEARING LOSS, LEFT EAR 04/16/2009  . TESTICULAR MASS, LEFT 04/16/2009  . SLEEP APNEA 04/16/2009    Evelene Croon, PTA 07/19/2015, 2:43 PM  [facility] [address] [place], [place], [address] Phone: [phone]   Fax:  [phone]

## 2015-07-23 ENCOUNTER — Encounter: Payer: Self-pay | Admitting: Physical Therapy

## 2015-07-26 ENCOUNTER — Ambulatory Visit: Payer: Worker's Compensation | Attending: Orthopedic Surgery | Admitting: Physical Therapy

## 2015-07-26 ENCOUNTER — Encounter: Payer: Self-pay | Admitting: Physical Therapy

## 2015-07-26 DIAGNOSIS — M25611 Stiffness of right shoulder, not elsewhere classified: Secondary | ICD-10-CM | POA: Diagnosis not present

## 2015-07-26 DIAGNOSIS — R531 Weakness: Secondary | ICD-10-CM | POA: Diagnosis present

## 2015-07-26 DIAGNOSIS — M25511 Pain in right shoulder: Secondary | ICD-10-CM | POA: Insufficient documentation

## 2015-07-26 NOTE — Therapy (Signed)
[facility] [address] [place], [place], [address] Phone: [phone]   Fax:  [phone]  Physical Therapy Treatment  Patient Details  Name: Brett Glover MRN: [id number] Date of Birth: 09-16-1966 Referring Provider:  Bradd Canary, MD  Encounter Date: 07/26/2015      PT End of Session - 07/26/15 1352    Visit Number 13   Number of Visits 20   Date for PT Re-Evaluation 08/17/15   PT Start Time 1344   PT Stop Time 1425   PT Time Calculation (min) 41 min   Activity Tolerance Patient tolerated treatment well   Behavior During Therapy [facility] for tasks assessed/performed      Past Medical History  Diagnosis Date  . Chicken pox as a child  . Broken jaw (HCC) 1983  . Kidney stones 1990  . Tobacco abuse 05/19/2012  . History of cervical fracture   . Hyperlipidemia   . Cervical vertebral closed fracture (HCC)   . Elevated cholesterol     Past Surgical History  Procedure Laterality Date  . Fusion in neck  2004  . Fatty tissue removed  2007    left abdominal wall, lipoma  . Kidney stones removed  1990  . Cervical fusion      There were no vitals filed for this visit.  Visit Diagnosis:  Stiffness of right shoulder joint  Pain in right shoulder  Weakness      Subjective Assessment - 07/26/15 1351    Subjective Reports 7/10 R shoulder pain for the past several days. Reports waking up and he is laying on R shoulder.   Pertinent History see above   Patient Stated Goals get full motion and strength   Currently in Pain? Yes   Pain Score 7    Pain Location Shoulder   Pain Orientation Right   Pain Descriptors / Indicators Nagging   Pain Type Surgical pain   Pain Onset 1 to 4 weeks ago            [facility] PT Assessment - 07/26/15 0001    Assessment   Medical Diagnosis s/p Rt RC surgery   Onset Date/Surgical Date 05/28/15   Hand Dominance Right   Next MD Visit 08/07/2015   Precautions   Precautions Shoulder    Type of Shoulder Precautions RCR   Precaution Comments Pt reported that Dr. Ranell Patrick told him to do seated active ER (hitchhiker)                     [facility] Adult PT Treatment/Exercise - 07/26/15 0001    Shoulder Exercises: Supine   Protraction AAROM;Limitations  x30 reps   Flexion AAROM;Other (comment)  x30 reps   Shoulder Exercises: Seated   Other Seated Exercises Ball rolls into flexion/ circles CW, CCW x30 reps   Shoulder Exercises: Standing   External Rotation AAROM;Right  x30 reps   Other Standing Exercises RUE wall ladder x3 min   Shoulder Exercises: Pulleys   Flexion Other (comment)  x5 min   Other Pulley Exercises UE Ranger flex/circles CW and CCW x20 reps each   Manual Therapy   Manual Therapy Passive ROM   Passive ROM R shoulder PROM into flex/scap/ER with gentle holds at end range                  PT Short Term Goals - 07/16/15 1352    PT SHORT TERM GOAL #1   Title Pt to verbalize understanding of protecting  healing tissues to prevent reinjury.   Time 2   Period Weeks   Status On-going   PT SHORT TERM GOAL #2   Title I with HEP   Time 4   Period Weeks   Status On-going   PT SHORT TERM GOAL #3   Title decrease pain by 50%   Time 4   Period Weeks   Status Achieved           PT Long Term Goals - 06/18/15 1429    PT LONG TERM GOAL #1   Title I with advanced HEP   Time 8   Period Weeks   Status On-going   PT LONG TERM GOAL #2   Title improve Rt shoulder flex/abd to 150 or greater to perform ADLS   Time 8   Period Weeks   Status On-going   PT LONG TERM GOAL #3   Title Improve right shoulder ER to 70 degrees or greater and IR within functional limits   Time 8   Period Weeks   Status On-going   PT LONG TERM GOAL #4   Title demo shoulder strength WFL for ADLS   Time 8   Period Weeks   Status On-going   PT LONG TERM GOAL #5   Title report pain 2/10 or less in right shoulder   Time 8   Period Weeks   Status On-going                Plan - 07/26/15 1429    Clinical Impression Statement Patient tolerated today's treatment well today with one episode of a popping sensation in R shoulder around [facility] joint without pain and grinding in posterior R shoulder during scaption. Completed all exercises as directed with minimal VCs for exercise technique. Firm end feels noted during R shoulder PROM/AAROM. The popping sensation experienced by patient in the R [facility] joint was experienced during P/AAROM flexion and the posterior R shoulder grinding occured during scaption but dissipated after a few more repititions. Denied any modalities during today's treatment and experienced 4/10 R shoulder pain following today's treatment.   Pt will benefit from skilled therapeutic intervention in order to improve on the following deficits Decreased range of motion;Decreased safety awareness;Impaired UE functional use;Pain;Decreased strength;Increased edema;Postural dysfunction   Rehab Potential Excellent   PT Frequency 3x / week   PT Duration 4 weeks   PT Treatment/Interventions ADLs/Self Care Home Management;Cryotherapy;Electrical Stimulation;Ultrasound;Patient/family education;Neuromuscular re-education;Therapeutic exercise;Manual techniques;Scar mobilization;Passive range of motion;Vasopneumatic Device   PT Next Visit Plan Progress to [facility] of R shoulder per MD order and MPT POC.  Please view new order under Media tab.   Consulted and Agree with Plan of Care Patient        Problem List Patient Active Problem List   Diagnosis Date Noted  . TIA (transient ischemic attack) 12/15/2012  . Numbness of extremity 12/15/2012  . Tobacco abuse 05/19/2012  . Pneumonia 05/19/2012  . Kidney stones   . History of cervical fracture   . Hyperlipidemia   . HEARING LOSS, LEFT EAR 04/16/2009  . TESTICULAR MASS, LEFT 04/16/2009  . SLEEP APNEA 04/16/2009    Evelene Croon, PTA 07/26/2015, 2:42 PM  [facility] [address] [place], [place], [address] Phone: [phone]   Fax:  [phone]

## 2015-07-30 ENCOUNTER — Encounter: Payer: Self-pay | Admitting: Physical Therapy

## 2015-07-30 ENCOUNTER — Ambulatory Visit: Payer: Worker's Compensation | Admitting: Physical Therapy

## 2015-07-30 DIAGNOSIS — M25611 Stiffness of right shoulder, not elsewhere classified: Secondary | ICD-10-CM

## 2015-07-30 DIAGNOSIS — R531 Weakness: Secondary | ICD-10-CM

## 2015-07-30 DIAGNOSIS — M25511 Pain in right shoulder: Secondary | ICD-10-CM

## 2015-07-30 NOTE — Therapy (Signed)
[facility] [address] [place], [place], [address] Phone: [phone]   Fax:  [phone]  Physical Therapy Treatment  Patient Details  Name: Brett Glover MRN: [id number] Date of Birth: 04/24/1966 Referring Provider:  Bradd Canary, MD  Encounter Date: 07/30/2015      PT End of Session - 07/30/15 1343    Visit Number 14   Number of Visits 20   Date for PT Re-Evaluation 08/17/15   PT Start Time 1343   PT Stop Time 1431   PT Time Calculation (min) 48 min   Activity Tolerance Patient tolerated treatment well   Behavior During Therapy [facility] for tasks assessed/performed      Past Medical History  Diagnosis Date  . Chicken pox as a child  . Broken jaw (HCC) 1983  . Kidney stones 1990  . Tobacco abuse 05/19/2012  . History of cervical fracture   . Hyperlipidemia   . Cervical vertebral closed fracture (HCC)   . Elevated cholesterol     Past Surgical History  Procedure Laterality Date  . Fusion in neck  2004  . Fatty tissue removed  2007    left abdominal wall, lipoma  . Kidney stones removed  1990  . Cervical fusion      There were no vitals filed for this visit.  Visit Diagnosis:  Stiffness of right shoulder joint  Pain in right shoulder  Weakness      Subjective Assessment - 07/30/15 1344    Subjective Reports 4/10 R shoulder aching today.   Pertinent History see above   Patient Stated Goals get full motion and strength   Currently in Pain? Yes   Pain Score 4    Pain Location Shoulder   Pain Orientation Right   Pain Descriptors / Indicators Aching   Pain Type Surgical pain   Pain Onset 1 to 4 weeks ago   Pain Frequency Constant            [facility] PT Assessment - 07/30/15 0001    Assessment   Medical Diagnosis s/p Rt RC surgery   Onset Date/Surgical Date 05/28/15   Hand Dominance Right   Next MD Visit 08/07/2015   Precautions   Precautions Shoulder   Type of Shoulder Precautions RCR    Precaution Comments Pt reported that Dr. Ranell Patrick told him to do seated active ER (hitchhiker)                     [facility] Adult PT Treatment/Exercise - 07/30/15 0001    Shoulder Exercises: Supine   Protraction AAROM;Limitations  x30 reps   External Rotation AAROM;Right  x30 reps   Shoulder Exercises: Seated   Other Seated Exercises Ball rolls into flexion/ circles CW, CCW x30 reps   Shoulder Exercises: Sidelying   Flexion AROM;AAROM;Right  x30 reps; used LLE at certain point to assist RLE   Shoulder Exercises: Standing   Other Standing Exercises RUE wall ladder x3 min   Shoulder Exercises: Pulleys   Flexion 3 minutes   ABduction 3 minutes;Other (comment)  in scaption   Other Pulley Exercises UE Ranger flex/circles CW and CCW x20 reps each   Manual Therapy   Manual Therapy Passive ROM   Passive ROM R shoulder PROM into flex/scap/ER with gentle holds at end range                  PT Short Term Goals - 07/16/15 1352    PT SHORT TERM GOAL #1  Title Pt to verbalize understanding of protecting healing tissues to prevent reinjury.   Time 2   Period Weeks   Status On-going   PT SHORT TERM GOAL #2   Title I with HEP   Time 4   Period Weeks   Status On-going   PT SHORT TERM GOAL #3   Title decrease pain by 50%   Time 4   Period Weeks   Status Achieved           PT Long Term Goals - 06/18/15 1429    PT LONG TERM GOAL #1   Title I with advanced HEP   Time 8   Period Weeks   Status On-going   PT LONG TERM GOAL #2   Title improve Rt shoulder flex/abd to 150 or greater to perform ADLS   Time 8   Period Weeks   Status On-going   PT LONG TERM GOAL #3   Title Improve right shoulder ER to 70 degrees or greater and IR within functional limits   Time 8   Period Weeks   Status On-going   PT LONG TERM GOAL #4   Title demo shoulder strength WFL for ADLS   Time 8   Period Weeks   Status On-going   PT LONG TERM GOAL #5   Title report pain 2/10 or less  in right shoulder   Time 8   Period Weeks   Status On-going               Plan - 07/30/15 1433    Clinical Impression Statement Patient tolerated today's treatment well today and experienced "very little" popping/grinding in R shoulder during today's treatment. Completed all exercises as directed with minimal multimodal cueing required. Firm end feels noted during R shoulder PROM/AAROM in all directions. PROM/AAROM of R shoulder observed as much smoother than previous treatments. Required LLE assist during RUE sidelying flexion to assist with ROM. Denied any pain following today's treatment.   Pt will benefit from skilled therapeutic intervention in order to improve on the following deficits Decreased range of motion;Decreased safety awareness;Impaired UE functional use;Pain;Decreased strength;Increased edema;Postural dysfunction   Rehab Potential Excellent   PT Frequency 3x / week   PT Duration 4 weeks   PT Treatment/Interventions ADLs/Self Care Home Management;Cryotherapy;Electrical Stimulation;Ultrasound;Patient/family education;Neuromuscular re-education;Therapeutic exercise;Manual techniques;Scar mobilization;Passive range of motion;Vasopneumatic Device   PT Next Visit Plan Progress to [facility] of R shoulder per MD order and MPT POC.  Please view new order under Media tab.   Consulted and Agree with Plan of Care Patient        Problem List Patient Active Problem List   Diagnosis Date Noted  . TIA (transient ischemic attack) 12/15/2012  . Numbness of extremity 12/15/2012  . Tobacco abuse 05/19/2012  . Pneumonia 05/19/2012  . Kidney stones   . History of cervical fracture   . Hyperlipidemia   . HEARING LOSS, LEFT EAR 04/16/2009  . TESTICULAR MASS, LEFT 04/16/2009  . SLEEP APNEA 04/16/2009    Evelene Croon, PTA 07/30/2015, 2:37 PM  [facility] [address] [place], [place], [address] Phone: [phone]   Fax:   [phone]

## 2015-08-02 ENCOUNTER — Ambulatory Visit: Payer: Worker's Compensation | Admitting: Physical Therapy

## 2015-08-06 ENCOUNTER — Encounter: Payer: Self-pay | Admitting: Physical Therapy

## 2015-08-06 ENCOUNTER — Ambulatory Visit: Payer: Worker's Compensation | Admitting: Physical Therapy

## 2015-08-06 DIAGNOSIS — M25511 Pain in right shoulder: Secondary | ICD-10-CM

## 2015-08-06 DIAGNOSIS — M25611 Stiffness of right shoulder, not elsewhere classified: Secondary | ICD-10-CM | POA: Diagnosis not present

## 2015-08-06 DIAGNOSIS — R531 Weakness: Secondary | ICD-10-CM

## 2015-08-06 NOTE — Therapy (Signed)
[facility] [address] [place], [place], [address] Phone: [phone]   Fax:  [phone]  Physical Therapy Treatment  Patient Details  Name: Brett Glover MRN: [id number] Date of Birth: 1965/10/28 Referring Provider: Dr. Ranell Patrick  Encounter Date: 08/06/2015      PT End of Session - 08/06/15 1346    Visit Number 15   Number of Visits 20   Date for PT Re-Evaluation 08/17/15   PT Start Time 1344   PT Stop Time 1425   PT Time Calculation (min) 41 min   Activity Tolerance Patient tolerated treatment well   Behavior During Therapy [facility] for tasks assessed/performed      Past Medical History  Diagnosis Date  . Chicken pox as a child  . Broken jaw (HCC) 1983  . Kidney stones 1990  . Tobacco abuse 05/19/2012  . History of cervical fracture   . Hyperlipidemia   . Cervical vertebral closed fracture (HCC)   . Elevated cholesterol     Past Surgical History  Procedure Laterality Date  . Fusion in neck  2004  . Fatty tissue removed  2007    left abdominal wall, lipoma  . Kidney stones removed  1990  . Cervical fusion      There were no vitals filed for this visit.  Visit Diagnosis:  Stiffness of right shoulder joint  Pain in right shoulder  Weakness      Subjective Assessment - 08/06/15 1345    Subjective Reports he thinks his shoulder is getting better. Reports that some days its miserable, others its numbness, etc.    Pertinent History see above   Patient Stated Goals get full motion and strength   Currently in Pain? Yes   Pain Score 4    Pain Location Shoulder   Pain Orientation Right   Pain Descriptors / Indicators Numbness   Pain Type Surgical pain   Pain Onset 1 to 4 weeks ago            [facility] PT Assessment - 08/06/15 0001    Assessment   Medical Diagnosis s/p Rt RC surgery   Referring Provider Dr. Ranell Patrick   Onset Date/Surgical Date 05/28/15   Hand Dominance Right   Next MD Visit 08/07/2015   Precautions   Precautions Shoulder   Type of Shoulder Precautions RCR   Precaution Comments Pt reported that Dr. Ranell Patrick told him to do seated active ER (hitchhiker)   ROM / Strength   AROM / PROM / Strength PROM   PROM   Overall PROM  Deficits   PROM Assessment Site Shoulder   Right/Left Shoulder Right   Right Shoulder Flexion 130 Degrees   Right Shoulder ABduction 163 Degrees  Scaption   Right Shoulder Internal Rotation 65 Degrees   Right Shoulder External Rotation 50 Degrees                     [facility] Adult PT Treatment/Exercise - 08/06/15 0001    Shoulder Exercises: Supine   Protraction AAROM  x30 reps; chest press   Flexion AAROM  x30 reps   Shoulder Exercises: Seated   External Rotation AAROM  x30 reps   Other Seated Exercises Ball rolls into flexion/ circles CW, CCW x30 reps   Shoulder Exercises: Sidelying   Flexion AROM;Right  No LLE assist today; 30 reps   Shoulder Exercises: Standing   Other Standing Exercises RUE wall ladder x3 min   Shoulder Exercises: Pulleys   Flexion 3 minutes  Other Pulley Exercises UE Ranger flex/circles CW and CCW x20 reps each   Manual Therapy   Manual Therapy Passive ROM   Passive ROM R shoulder PROM into flex/scap/ER/IR with gentle holds at end range                  PT Short Term Goals - 07/16/15 1352    PT SHORT TERM GOAL #1   Title Pt to verbalize understanding of protecting healing tissues to prevent reinjury.   Time 2   Period Weeks   Status On-going   PT SHORT TERM GOAL #2   Title I with HEP   Time 4   Period Weeks   Status On-going   PT SHORT TERM GOAL #3   Title decrease pain by 50%   Time 4   Period Weeks   Status Achieved           PT Long Term Goals - 06/18/15 1429    PT LONG TERM GOAL #1   Title I with advanced HEP   Time 8   Period Weeks   Status On-going   PT LONG TERM GOAL #2   Title improve Rt shoulder flex/abd to 150 or greater to perform ADLS   Time 8   Period Weeks    Status On-going   PT LONG TERM GOAL #3   Title Improve right shoulder ER to 70 degrees or greater and IR within functional limits   Time 8   Period Weeks   Status On-going   PT LONG TERM GOAL #4   Title demo shoulder strength WFL for ADLS   Time 8   Period Weeks   Status On-going   PT LONG TERM GOAL #5   Title report pain 2/10 or less in right shoulder   Time 8   Period Weeks   Status On-going               Plan - 08/06/15 1428    Clinical Impression Statement Patient tolerated today's treatment fairly well today although he continued to have the posterior R shoulder popping during scaption and numbness today. Completed all exercises as directed with minimal multimodal cueing required. Firm end feels were noted during R shoulder P/AAROM in all directions. Did not require LUE assist during L sidelying R shoulder flexion. Continue to experience R shoulder numbness following today's treatment.   Pt will benefit from skilled therapeutic intervention in order to improve on the following deficits Decreased range of motion;Decreased safety awareness;Impaired UE functional use;Pain;Decreased strength;Increased edema;Postural dysfunction   Rehab Potential Excellent   PT Frequency 3x / week   PT Duration 4 weeks   PT Treatment/Interventions ADLs/Self Care Home Management;Cryotherapy;Electrical Stimulation;Ultrasound;Patient/family education;Neuromuscular re-education;Therapeutic exercise;Manual techniques;Scar mobilization;Passive range of motion;Vasopneumatic Device   PT Next Visit Plan Progress to [facility] of R shoulder per MD order and MPT POC.  Please view new order under Media tab.   Consulted and Agree with Plan of Care Patient        Problem List Patient Active Problem List   Diagnosis Date Noted  . TIA (transient ischemic attack) 12/15/2012  . Numbness of extremity 12/15/2012  . Tobacco abuse 05/19/2012  . Pneumonia 05/19/2012  . Kidney stones   . History of cervical  fracture   . Hyperlipidemia   . HEARING LOSS, LEFT EAR 04/16/2009  . TESTICULAR MASS, LEFT 04/16/2009  . SLEEP APNEA 04/16/2009    Florence Canner, PTA 08/06/2015 3:10 PM [place] MPT [facility] [address] [place], [place], [address] Phone: [phone]   Fax:  [phone]  Name: Brett Glover MRN: [id number] Date of Birth: 1966/07/02

## 2015-08-09 ENCOUNTER — Ambulatory Visit: Payer: Worker's Compensation | Admitting: Physical Therapy

## 2015-08-09 ENCOUNTER — Encounter: Payer: Self-pay | Admitting: Physical Therapy

## 2015-08-09 DIAGNOSIS — M25511 Pain in right shoulder: Secondary | ICD-10-CM

## 2015-08-09 DIAGNOSIS — R531 Weakness: Secondary | ICD-10-CM

## 2015-08-09 DIAGNOSIS — M25611 Stiffness of right shoulder, not elsewhere classified: Secondary | ICD-10-CM

## 2015-08-09 NOTE — Therapy (Signed)
[facility] [address] [place], [place], [address] Phone: [phone]   Fax:  [phone]  Physical Therapy Treatment  Patient Details  Name: Brett Glover MRN: [id number] Date of Birth: 1966-04-27 Referring Provider: Dr. Ranell PatrickNorris  Encounter Date: 08/09/2015      PT End of Session - 08/09/15 1346    Visit Number 16   Number of Visits 28  Recieved NO from Dr. Ranell PatrickNorris 08/09/2015 for 2x/week for 4 weeks or PT discretion   Date for PT Re-Evaluation 09/06/15   PT Start Time 1344   PT Stop Time 1430   PT Time Calculation (min) 46 min   Activity Tolerance Patient tolerated treatment well   Behavior During Therapy [facility] for tasks assessed/performed      Past Medical History  Diagnosis Date  . Chicken pox as a child  . Broken jaw (HCC) 1983  . Kidney stones 1990  . Tobacco abuse 05/19/2012  . History of cervical fracture   . Hyperlipidemia   . Cervical vertebral closed fracture (HCC)   . Elevated cholesterol     Past Surgical History  Procedure Laterality Date  . Fusion in neck  2004  . Fatty tissue removed  2007    left abdominal wall, lipoma  . Kidney stones removed  1990  . Cervical fusion      There were no vitals filed for this visit.  Visit Diagnosis:  Stiffness of right shoulder joint  Weakness  Pain in right shoulder      Subjective Assessment - 08/09/15 1404    Subjective Reports that MD said it was right on track with progress.   Pertinent History see above   Patient Stated Goals get full motion and strength   Currently in Pain? No/denies            [facility] PT Assessment - 08/09/15 0001    Assessment   Medical Diagnosis s/p Rt RC surgery   Onset Date/Surgical Date 05/28/15   Hand Dominance Right   Next MD Visit 09/06/2015   Precautions   Precautions Shoulder   Type of Shoulder Precautions RCR   Precaution Comments Pt reported that Dr. Ranell PatrickNorris told him to do seated active ER (hitchhiker)                      [facility]Ex[facility] - 08/09/15 0001    Shoulder Exercises: Supine   Protraction AROM;Right;20 reps   External Rotation AROM;Both;20 reps   Flexion AROM;Both;20 reps   Shoulder Exercises: Seated   External Rotation AAROM  x30 reps   Other Seated Exercises Ball rolls into flexion/ circles CW, CCW x30 reps   Shoulder Exercises: Sidelying   Flexion AROM;Right;20 reps   Shoulder Exercises: Standing   Flexion AAROM;Right;20 reps  towel slides   Other Standing Exercises RUE wall ladder x2 min   Shoulder Exercises: Pulleys   Flexion 3 minutes   Other Pulley Exercises UE Ranger flex/circles CW and CCW x20 reps each   Shoulder Exercises: ROM/Strengthening   Rhythmic Stabilization, Supine R shoulder flex at 90 deg x6 reps, ER at 45 deg and 90 deg   Manual Therapy   Manual Therapy Passive ROM   Passive ROM R shoulder PROM into flex/scap/ER/IR with gentle holds at end range                  PT Short Term Goals - 07/16/15 1352    PT SHORT TERM GOAL #1   Title  Pt to verbalize understanding of protecting healing tissues to prevent reinjury.   Time 2   Period Weeks   Status On-going   PT SHORT TERM GOAL #2   Title I with HEP   Time 4   Period Weeks   Status On-going   PT SHORT TERM GOAL #3   Title decrease pain by 50%   Time 4   Period Weeks   Status Achieved           PT Long Term Goals - 06/18/15 1429    PT LONG TERM GOAL #1   Title I with advanced HEP   Time 8   Period Weeks   Status On-going   PT LONG TERM GOAL #2   Title improve Rt shoulder flex/abd to 150 or greater to perform ADLS   Time 8   Period Weeks   Status On-going   PT LONG TERM GOAL #3   Title Improve right shoulder ER to 70 degrees or greater and IR within functional limits   Time 8   Period Weeks   Status On-going   PT LONG TERM GOAL #4   Title demo shoulder strength WFL for ADLS   Time 8   Period Weeks   Status On-going   PT LONG TERM GOAL  #5   Title report pain 2/10 or less in right shoulder   Time 8   Period Weeks   Status On-going               Plan - 08/09/15 1440    Clinical Impression Statement Patient tolerated today's treatment very well and tolerated new exercises well with no increased pain or soreness complaints. Completed all exercises as directed with minimal multimodal cueing for correct exercise technique. Firm end feels noted during P/AAROM of the R shoulder with no popping although patient notes that the popping doesn't stress him as much after talking with MD. Toni Amend well with supine AROM shoulder exercises without complaint of pain. Experienced 1/10 R shoulder irritation following today's treatment.   Pt will benefit from skilled therapeutic intervention in order to improve on the following deficits Decreased range of motion;Decreased safety awareness;Impaired UE functional use;Pain;Decreased strength;Increased edema;Postural dysfunction   Rehab Potential Excellent   PT Frequency 3x / week   PT Duration 4 weeks   PT Treatment/Interventions ADLs/Self Care Home Management;Cryotherapy;Electrical Stimulation;Ultrasound;Patient/family education;Neuromuscular re-education;Therapeutic exercise;Manual techniques;Scar mobilization;Passive range of motion;Vasopneumatic Device   PT Next Visit Plan Progress to AAROM- AROM and PREs of R shoulder per MD order and MPT POC.  Please view new order under Media tab.   Consulted and Agree with Plan of Care Patient        Problem List Patient Active Problem List   Diagnosis Date Noted  . TIA (transient ischemic attack) 12/15/2012  . Numbness of extremity 12/15/2012  . Tobacco abuse 05/19/2012  . Pneumonia 05/19/2012  . Kidney stones   . History of cervical fracture   . Hyperlipidemia   . HEARING LOSS, LEFT EAR 04/16/2009  . TESTICULAR MASS, LEFT 04/16/2009  . SLEEP APNEA 04/16/2009    Evelene Croon, PTA 08/09/2015, 2:45 PM  [facility] [address] [place], [place], [address] Phone: [phone]   Fax:  [phone]  Name: Brett Glover MRN: [id number] Date of Birth: 1966/06/21

## 2015-08-13 ENCOUNTER — Encounter: Payer: Self-pay | Admitting: Physical Therapy

## 2015-08-13 ENCOUNTER — Ambulatory Visit: Payer: Worker's Compensation | Admitting: Physical Therapy

## 2015-08-13 DIAGNOSIS — M25611 Stiffness of right shoulder, not elsewhere classified: Secondary | ICD-10-CM

## 2015-08-13 DIAGNOSIS — R531 Weakness: Secondary | ICD-10-CM

## 2015-08-13 DIAGNOSIS — M25511 Pain in right shoulder: Secondary | ICD-10-CM

## 2015-08-13 NOTE — Therapy (Signed)
[facility] [address] [place], [place], [address] Phone: [phone]   Fax:  [phone]  Physical Therapy Treatment  Patient Details  Name: Brett Glover MRN: [id number] Date of Birth: [birth year] Referring Provider: Dr. Ranell Patrick  Encounter Date: 08/13/2015      PT End of Session - 08/13/15 1438    Visit Number 17   Number of Visits 28   Date for PT Re-Evaluation 09/06/15   PT Start Time 1345   PT Stop Time 1430   PT Time Calculation (min) 45 min   Activity Tolerance Patient tolerated treatment well   Behavior During Therapy [facility] for tasks assessed/performed      Past Medical History  Diagnosis Date  . Chicken pox as a child  . Broken jaw (HCC) 1983  . Kidney stones 1990  . Tobacco abuse 05/19/2012  . History of cervical fracture   . Hyperlipidemia   . Cervical vertebral closed fracture (HCC)   . Elevated cholesterol     Past Surgical History  Procedure Laterality Date  . Fusion in neck  2004  . Fatty tissue removed  2007    left abdominal wall, lipoma  . Kidney stones removed  1990  . Cervical fusion      There were no vitals filed for this visit.  Visit Diagnosis:  Stiffness of right shoulder joint  Weakness  Pain in right shoulder      Subjective Assessment - 08/13/15 1346    Subjective Reports he has been doing the exercises done in therapy at home and thinks that the discomfort he feels today may be coming from that. "I done need a paper I know what to do at home."   Pertinent History see above   Patient Stated Goals get full motion and strength   Currently in Pain? Yes   Pain Score 4    Pain Location Shoulder   Pain Orientation Right   Pain Descriptors / Indicators Discomfort   Pain Type Surgical pain   Pain Onset 1 to 4 weeks ago            [facility] PT Assessment - 08/13/15 0001    Assessment   Medical Diagnosis s/p Rt RC surgery   Onset Date/Surgical Date 05/28/15   Hand Dominance Right    Next MD Visit 09/06/2015   Precautions   Precautions Shoulder   Type of Shoulder Precautions RCR   Precaution Comments Pt reported that Dr. Ranell Patrick told him to do seated active ER (hitchhiker)                     [facility] Adult PT Treatment/Exercise - 08/13/15 0001    Shoulder Exercises: Supine   Protraction AROM;Right;20 reps   External Rotation AROM;Right;20 reps   Flexion AROM;AAROM;20 reps;Right  x20 reps each   Shoulder Exercises: Seated   External Rotation AAROM  x30 reps   Other Seated Exercises Ball rolls into flexion/ circles CW, CCW x30 reps   Shoulder Exercises: Standing   Flexion AAROM;Right;20 reps   Other Standing Exercises RUE wall ladder x2 min   Shoulder Exercises: Pulleys   Flexion 3 minutes   Other Pulley Exercises UE Ranger flex/circles CW and CCW x20 reps each   Shoulder Exercises: ROM/Strengthening   Rhythmic Stabilization, Supine R shoulder flex at 90 deg , ER at 45 deg and 90 deg x10 min   Manual Therapy   Manual Therapy Passive ROM   Passive ROM R shoulder PROM into flex//ER/IR  with gentle holds at end range                  PT Short Term Goals - 08/13/15 1347    PT SHORT TERM GOAL #1   Title Pt to verbalize understanding of protecting healing tissues to prevent reinjury.   Time 2   Period Weeks   Status Achieved   PT SHORT TERM GOAL #2   Title I with HEP   Time 4   Period Weeks   Status On-going   PT SHORT TERM GOAL #3   Title decrease pain by 50%   Time 4   Period Weeks   Status Achieved           PT Long Term Goals - 06/18/15 1429    PT LONG TERM GOAL #1   Title I with advanced HEP   Time 8   Period Weeks   Status On-going   PT LONG TERM GOAL #2   Title improve Rt shoulder flex/abd to 150 or greater to perform ADLS   Time 8   Period Weeks   Status On-going   PT LONG TERM GOAL #3   Title Improve right shoulder ER to 70 degrees or greater and IR within functional limits   Time 8   Period Weeks   Status  On-going   PT LONG TERM GOAL #4   Title demo shoulder strength WFL for ADLS   Time 8   Period Weeks   Status On-going   PT LONG TERM GOAL #5   Title report pain 2/10 or less in right shoulder   Time 8   Period Weeks   Status On-going               Plan - 08/13/15 1347    Clinical Impression Statement Patient tolerated today's treatment well today and completed all exercises well with no increased pain or soreness complaints. Demonstrated improved R shoulder stability with rhythmic stabilization in flexion but continues to have diminished stabiltiy in ER. Firm  end feels noted during R shoulder PROM in all directions. Popping in R shoulder noted intermittantly during R shoulder flexion and scaption and scaption was discontinued for today's treatment. Continues to progress well with AROM exercises. Experienced R shoulder discomfort following therapy but gave no numerical rating.   Pt will benefit from skilled therapeutic intervention in order to improve on the following deficits Decreased range of motion;Decreased safety awareness;Impaired UE functional use;Pain;Decreased strength;Increased edema;Postural dysfunction   Rehab Potential Excellent   PT Frequency 3x / week   PT Duration 4 weeks   PT Treatment/Interventions ADLs/Self Care Home Management;Cryotherapy;Electrical Stimulation;Ultrasound;Patient/family education;Neuromuscular re-education;Therapeutic exercise;Manual techniques;Scar mobilization;Passive range of motion;Vasopneumatic Device   PT Next Visit Plan Progress to AAROM- AROM and PREs of R shoulder per MD order and MPT POC.  Please view new order under Media tab.   Consulted and Agree with Plan of Care Patient        Problem List Patient Active Problem List   Diagnosis Date Noted  . TIA (transient ischemic attack) 12/15/2012  . Numbness of extremity 12/15/2012  . Tobacco abuse 05/19/2012  . Pneumonia 05/19/2012  . Kidney stones   . History of cervical fracture    . Hyperlipidemia   . HEARING LOSS, LEFT EAR 04/16/2009  . TESTICULAR MASS, LEFT 04/16/2009  . SLEEP APNEA 04/16/2009    Evelene Croon, PTA 08/13/2015, 2:48 PM  [facility] [address] [place], [place], [address] Phone: [phone]  Fax:  [phone]  Name: Brett Glover MRN: [id number] Date of Birth: 1965-10-21

## 2015-08-16 ENCOUNTER — Ambulatory Visit: Payer: Worker's Compensation | Admitting: Physical Therapy

## 2015-08-16 ENCOUNTER — Encounter: Payer: Self-pay | Admitting: Physical Therapy

## 2015-08-16 DIAGNOSIS — M25511 Pain in right shoulder: Secondary | ICD-10-CM

## 2015-08-16 DIAGNOSIS — R531 Weakness: Secondary | ICD-10-CM

## 2015-08-16 DIAGNOSIS — M25611 Stiffness of right shoulder, not elsewhere classified: Secondary | ICD-10-CM

## 2015-08-16 NOTE — Therapy (Signed)
[facility] [address] [place], [place], [address] Phone: [phone]   Fax:  [phone]  Physical Therapy Treatment  Patient Details  Name: Brett Glover MRN: [id number] Date of Birth: June 07, 1966 Referring Provider: Dr. Ranell Patrick  Encounter Date: 08/16/2015      PT End of Session - 08/16/15 1350    Visit Number 18   Number of Visits 28   Date for PT Re-Evaluation 09/06/15   PT Start Time 1348   PT Stop Time 1422   PT Time Calculation (min) 34 min   Activity Tolerance Patient tolerated treatment well   Behavior During Therapy [facility] for tasks assessed/performed      Past Medical History  Diagnosis Date  . Chicken pox as a child  . Broken jaw (HCC) 1983  . Kidney stones 1990  . Tobacco abuse 05/19/2012  . History of cervical fracture   . Hyperlipidemia   . Cervical vertebral closed fracture (HCC)   . Elevated cholesterol     Past Surgical History  Procedure Laterality Date  . Fusion in neck  2004  . Fatty tissue removed  2007    left abdominal wall, lipoma  . Kidney stones removed  1990  . Cervical fusion      There were no vitals filed for this visit.  Visit Diagnosis:  Stiffness of right shoulder joint  Weakness  Pain in right shoulder      Subjective Assessment - 08/16/15 1349    Subjective States that his R shoulder is inflammed from previous treatment.   Pertinent History see above   Patient Stated Goals get full motion and strength   Currently in Pain? Yes   Pain Score 7    Pain Location Shoulder   Pain Orientation Right   Pain Descriptors / Indicators Penetrating   Pain Type Surgical pain   Pain Onset 1 to 4 weeks ago            [facility] PT Assessment - 08/16/15 0001    Assessment   Medical Diagnosis s/p Rt RC surgery   Onset Date/Surgical Date 05/28/15   Hand Dominance Right   Next MD Visit 09/06/2015   Precautions   Precautions Shoulder   Type of Shoulder Precautions RCR   Precaution  Comments Pt reported that Dr. Ranell Patrick told him to do seated active ER (hitchhiker)                     [facility] Adult PT Treatment/Exercise - 08/16/15 0001    Shoulder Exercises: Supine   Protraction AROM;Right  x30 reps   Flexion AROM;Right  x30 reps   Shoulder Exercises: Seated   Other Seated Exercises Ball rolls into flexion/ circles CW, CCW x30 reps   Shoulder Exercises: Sidelying   External Rotation AROM;Right  x30 reps   Flexion AROM;Right  x30 reps   Shoulder Exercises: Standing   Other Standing Exercises RUE wall ladder x2 min   Shoulder Exercises: Pulleys   Flexion 3 minutes   Other Pulley Exercises UE Ranger flex/circles CW and CCW x20 reps each   Shoulder Exercises: ROM/Strengthening   Rhythmic Stabilization, Supine R shoulder flex at 90 deg , ER at 45 deg and 90 deg x11 min                  PT Short Term Goals - 08/16/15 1410    PT SHORT TERM GOAL #1   Title Pt to verbalize understanding of protecting healing tissues to prevent  reinjury.   Time 2   Period Weeks   Status Achieved   PT SHORT TERM GOAL #2   Title I with HEP   Time 4   Period Weeks   Status Achieved   PT SHORT TERM GOAL #3   Title decrease pain by 50%   Time 4   Period Weeks   Status Achieved           PT Long Term Goals - 06/18/15 1429    PT LONG TERM GOAL #1   Title I with advanced HEP   Time 8   Period Weeks   Status On-going   PT LONG TERM GOAL #2   Title improve Rt shoulder flex/abd to 150 or greater to perform ADLS   Time 8   Period Weeks   Status On-going   PT LONG TERM GOAL #3   Title Improve right shoulder ER to 70 degrees or greater and IR within functional limits   Time 8   Period Weeks   Status On-going   PT LONG TERM GOAL #4   Title demo shoulder strength WFL for ADLS   Time 8   Period Weeks   Status On-going   PT LONG TERM GOAL #5   Title report pain 2/10 or less in right shoulder   Time 8   Period Weeks   Status On-going                Plan - 08/16/15 1426    Clinical Impression Statement Patient tolerated today's treatment well with only complaint of posterior R shoulder pain which he indicated was on the lateral edge of the R scapula during AROM chest press. Continues to do well with R shoulder stability with rhythmic stabilzations although he continues to have some instability to improve upon especially ER. Patient requested to take today's treatment easy per reports of increased pain from previous treatment so easy AROM exercises were completed. Has achieved all ST goals set at evaluation now with patient stating he does the exercises at home that are completed in therapy and does not need a HEP. Denied any modalities with today's treatment. Experienced 6/10 R shoulder pain upon end of the treatment.   Pt will benefit from skilled therapeutic intervention in order to improve on the following deficits Decreased range of motion;Decreased safety awareness;Impaired UE functional use;Pain;Decreased strength;Increased edema;Postural dysfunction   Rehab Potential Excellent   PT Frequency 3x / week   PT Duration 4 weeks   PT Treatment/Interventions ADLs/Self Care Home Management;Cryotherapy;Electrical Stimulation;Ultrasound;Patient/family education;Neuromuscular re-education;Therapeutic exercise;Manual techniques;Scar mobilization;Passive range of motion;Vasopneumatic Device   PT Next Visit Plan Progress to AAROM- AROM and PREs of R shoulder per MD order and MPT POC.  Please view new order under Media tab.   Consulted and Agree with Plan of Care Patient        Problem List Patient Active Problem List   Diagnosis Date Noted  . TIA (transient ischemic attack) 12/15/2012  . Numbness of extremity 12/15/2012  . Tobacco abuse 05/19/2012  . Pneumonia 05/19/2012  . Kidney stones   . History of cervical fracture   . Hyperlipidemia   . HEARING LOSS, LEFT EAR 04/16/2009  . TESTICULAR MASS, LEFT 04/16/2009  . SLEEP  APNEA 04/16/2009    Evelene CroonKelsey M Parsons, PTA 08/16/2015, 2:37 PM  [facility] [address] [place], [place], [address] Phone: [phone]   Fax:  [phone]  Name: Brett Glover MRN: [id number] Date of Birth: 1966/03/06

## 2015-08-20 ENCOUNTER — Ambulatory Visit: Payer: Worker's Compensation | Admitting: Physical Therapy

## 2015-08-23 ENCOUNTER — Encounter: Payer: Self-pay | Admitting: Physical Therapy

## 2015-08-23 ENCOUNTER — Ambulatory Visit: Payer: Worker's Compensation | Attending: Orthopedic Surgery | Admitting: Physical Therapy

## 2015-08-23 DIAGNOSIS — M25611 Stiffness of right shoulder, not elsewhere classified: Secondary | ICD-10-CM | POA: Diagnosis not present

## 2015-08-23 DIAGNOSIS — R531 Weakness: Secondary | ICD-10-CM | POA: Diagnosis present

## 2015-08-23 DIAGNOSIS — M25511 Pain in right shoulder: Secondary | ICD-10-CM | POA: Diagnosis present

## 2015-08-23 NOTE — Therapy (Signed)
[facility] [address] [place], [place], [address] Phone: [phone]   Fax:  [phone]  Physical Therapy Treatment  Patient Details  Name: Brett Glover MRN: [id number] Date of Birth: Feb 27, 1966 Referring Provider: Dr. Ranell Patrick  Encounter Date: 08/23/2015      PT End of Session - 08/23/15 1345    Visit Number 19   Number of Visits 28   Date for PT Re-Evaluation 09/06/15   PT Start Time 1342   PT Stop Time 1428   PT Time Calculation (min) 46 min   Activity Tolerance Patient tolerated treatment well   Behavior During Therapy [facility] for tasks assessed/performed      Past Medical History  Diagnosis Date  . Chicken pox as a child  . Broken jaw (HCC) 1983  . Kidney stones 1990  . Tobacco abuse 05/19/2012  . History of cervical fracture   . Hyperlipidemia   . Cervical vertebral closed fracture (HCC)   . Elevated cholesterol     Past Surgical History  Procedure Laterality Date  . Fusion in neck  2004  . Fatty tissue removed  2007    left abdominal wall, lipoma  . Kidney stones removed  1990  . Cervical fusion      There were no vitals filed for this visit.  Visit Diagnosis:  Stiffness of right shoulder joint  Weakness  Pain in right shoulder      Subjective Assessment - 08/23/15 1353    Subjective No new complaints and states he doesn't really have pain.   Pertinent History see above   Patient Stated Goals get full motion and strength   Currently in Pain? Yes   Pain Score 1    Pain Location Shoulder   Pain Orientation Right   Pain Descriptors / Indicators Other (Comment)  "irritable"   Pain Type Surgical pain   Pain Onset 1 to 4 weeks ago            [facility] PT Assessment - 08/23/15 0001    Assessment   Medical Diagnosis s/p Rt RC surgery   Onset Date/Surgical Date 05/28/15   Hand Dominance Right   Next MD Visit 09/06/2015   Precautions   Precautions Shoulder   Type of Shoulder Precautions RCR    Precaution Comments Pt reported that Dr. Ranell Patrick told him to do seated active ER (hitchhiker)                     [facility] Adult PT Treatment/Exercise - 08/23/15 0001    Shoulder Exercises: Supine   Protraction AROM;Right  x30 reps   Shoulder Exercises: Seated   Flexion AROM;Right;20 reps   Other Seated Exercises Seated flexion stretch with blue ball x20 reps   Other Seated Exercises Seated R shoulder AROM scaption x5 reps   Shoulder Exercises: Sidelying   External Rotation AROM;Right  x30 reps   Flexion AROM;Right;Other (comment)  x30 reps   Shoulder Exercises: Standing   Protraction Strengthening;Right;20 reps;Theraband   Theraband Level (Shoulder Protraction) Level 1 (Yellow)   External Rotation Strengthening;Right;20 reps;Theraband   Theraband Level (Shoulder External Rotation) Level 1 (Yellow)   Internal Rotation Strengthening;Right;20 reps;Theraband   Theraband Level (Shoulder Internal Rotation) Level 1 (Yellow)   Extension --   Theraband Level (Shoulder Extension) --   Row Strengthening;Right;20 reps;Theraband   Theraband Level (Shoulder Row) Level 1 (Yellow)   Other Standing Exercises RUE wall ladder x3 min   Shoulder Exercises: Pulleys   Flexion 3 minutes  Other Pulley Exercises UE Ranger flex/circles CW and CCW x20 reps each   Shoulder Exercises: ROM/Strengthening   Rhythmic Stabilization, Supine R shoulder flex at 90 deg , ER at 45 deg and 90 deg x14 min                  PT Short Term Goals - 08/16/15 1410    PT SHORT TERM GOAL #1   Title Pt to verbalize understanding of protecting healing tissues to prevent reinjury.   Time 2   Period Weeks   Status Achieved   PT SHORT TERM GOAL #2   Title I with HEP   Time 4   Period Weeks   Status Achieved   PT SHORT TERM GOAL #3   Title decrease pain by 50%   Time 4   Period Weeks   Status Achieved           PT Long Term Goals - 06/18/15 1429    PT LONG TERM GOAL #1   Title I with advanced  HEP   Time 8   Period Weeks   Status On-going   PT LONG TERM GOAL #2   Title improve Rt shoulder flex/abd to 150 or greater to perform ADLS   Time 8   Period Weeks   Status On-going   PT LONG TERM GOAL #3   Title Improve right shoulder ER to 70 degrees or greater and IR within functional limits   Time 8   Period Weeks   Status On-going   PT LONG TERM GOAL #4   Title demo shoulder strength WFL for ADLS   Time 8   Period Weeks   Status On-going   PT LONG TERM GOAL #5   Title report pain 2/10 or less in right shoulder   Time 8   Period Weeks   Status On-going               Plan - 08/23/15 1429    Clinical Impression Statement Patient tolerated today's treatment well with only complaint of R anterior shoulder sensation that was not described by patient during seated R shoulder flexion and scaption. MPT was consulted regarding patient's 12 week post-op status and beginning bands in which he approved. Tolerated R shoulder PREs of theraband strengthening well and denied any pain or discomfort during any of the band exercises. Continues to show improvement regarding R shoulder stabiltiy with rhythmic stabilzations in ER and flexion.    Pt will benefit from skilled therapeutic intervention in order to improve on the following deficits Decreased range of motion;Decreased safety awareness;Impaired UE functional use;Pain;Decreased strength;Increased edema;Postural dysfunction   Rehab Potential Excellent   PT Frequency 3x / week   PT Duration 4 weeks   PT Treatment/Interventions ADLs/Self Care Home Management;Cryotherapy;Electrical Stimulation;Ultrasound;Patient/family education;Neuromuscular re-education;Therapeutic exercise;Manual techniques;Scar mobilization;Passive range of motion;Vasopneumatic Device   PT Next Visit Plan Progress to AAROM- AROM and PREs of R shoulder per MD order and MPT POC.  Please view new order under Media tab.   Consulted and Agree with Plan of Care Patient         Problem List Patient Active Problem List   Diagnosis Date Noted  . TIA (transient ischemic attack) 12/15/2012  . Numbness of extremity 12/15/2012  . Tobacco abuse 05/19/2012  . Pneumonia 05/19/2012  . Kidney stones   . History of cervical fracture   . Hyperlipidemia   . HEARING LOSS, LEFT EAR 04/16/2009  . TESTICULAR MASS, LEFT 04/16/2009  . SLEEP APNEA 04/16/2009  Florence CannerKelsey Parsons, PTA 08/23/2015 2:33 PM  [facility] [address] [place], [place], [address] Phone: [phone]   Fax:  [phone]  Name: Landry DykeRichard J Yom MRN: [id number] Date of Birth: 1965-12-10

## 2015-08-27 ENCOUNTER — Ambulatory Visit: Payer: Worker's Compensation | Admitting: Physical Therapy

## 2015-08-27 ENCOUNTER — Encounter: Payer: Self-pay | Admitting: Physical Therapy

## 2015-08-27 DIAGNOSIS — M25611 Stiffness of right shoulder, not elsewhere classified: Secondary | ICD-10-CM | POA: Diagnosis not present

## 2015-08-27 DIAGNOSIS — R531 Weakness: Secondary | ICD-10-CM

## 2015-08-27 DIAGNOSIS — M25511 Pain in right shoulder: Secondary | ICD-10-CM

## 2015-08-27 NOTE — Patient Instructions (Signed)
Strengthening: Resisted External Rotation   Hold tubing in right hand, elbow at side and forearm across body. Rotate forearm out. Repeat _10___ times per set. Do _2-3___ sets per session. Do __2-3__ sessions per day.  http://orth.exer.us/828   Copyright  VHI. All rights reserved.  Strengthening: Resisted Internal Rotation   Hold tubing in left hand, elbow at side and forearm out. Rotate forearm in across body. Repeat _10___ times per set. Do __2-3__ sets per session. Do _2-3___ sessions per day.  http://orth.exer.us/830   Copyright  VHI. All rights reserved.  Strengthening: Resisted Extension   Hold tubing in right hand, arm forward. Pull arm back, elbow straight. Repeat __10__ times per set. Do __2-3__ sets per session. Do _2-3___ sessions per day.  http://orth.exer.us/832   Copyright  VHI. All rights reserved.  Scapular Retraction: Bilateral   Facing anchor, pull arms back, bringing shoulder blades together. Repeat __10__ times per set. Do _2-3___ sets per session. Do _2-3___ sessions per day.  http://orth.exer.us/176   Copyright  VHI. All rights reserved.   

## 2015-08-27 NOTE — Therapy (Signed)
[facility] [address] [place], [place], [address] Phone: [phone]   Fax:  [phone]  Physical Therapy Treatment  Patient Details  Name: Brett Glover MRN: [id number] Date of Birth: 01/08/1966 Referring Provider: Dr. Ranell Patrick  Encounter Date: 08/27/2015      PT End of Session - 08/27/15 1354    Visit Number 20   Number of Visits 28   Date for PT Re-Evaluation 09/06/15   PT Start Time 1347   PT Stop Time 1426   PT Time Calculation (min) 39 min   Activity Tolerance Patient tolerated treatment well   Behavior During Therapy [facility] for tasks assessed/performed      Past Medical History  Diagnosis Date  . Chicken pox as a child  . Broken jaw (HCC) 1983  . Kidney stones 1990  . Tobacco abuse 05/19/2012  . History of cervical fracture   . Hyperlipidemia   . Cervical vertebral closed fracture (HCC)   . Elevated cholesterol     Past Surgical History  Procedure Laterality Date  . Fusion in neck  2004  . Fatty tissue removed  2007    left abdominal wall, lipoma  . Kidney stones removed  1990  . Cervical fusion      There were no vitals filed for this visit.  Visit Diagnosis:  Stiffness of right shoulder joint  Weakness  Pain in right shoulder      Subjective Assessment - 08/27/15 1353    Subjective Reports sleeping on R shoulder this morning and hurting his back yesterday in an unknown way.   Pertinent History see above   Patient Stated Goals get full motion and strength   Currently in Pain? Yes   Pain Score 4    Pain Location Shoulder   Pain Orientation Right   Pain Descriptors / Indicators Tender   Pain Type Surgical pain   Pain Onset 1 to 4 weeks ago            [facility] PT Assessment - 08/27/15 0001    Assessment   Medical Diagnosis s/p Rt RC surgery   Onset Date/Surgical Date 05/28/15   Hand Dominance Right   Next MD Visit 09/06/2015   Precautions   Precautions Shoulder   Type of Shoulder  Precautions RCR   Precaution Comments Pt reported that Dr. Ranell Patrick told him to do seated active ER (hitchhiker)                     [facility] Adult PT Treatment/Exercise - 08/27/15 0001    Shoulder Exercises: Supine   Protraction Strengthening;Right;20 reps;Weights   Protraction Weight (lbs) 1   Shoulder Exercises: Seated   Other Seated Exercises Seated flexion stretch, rolls with blue ball x20 reps   Shoulder Exercises: Prone   Retraction Strengthening;Right;Weights;20 reps   Retraction Weight (lbs) 1   Retraction Limitations Bent   Extension Strengthening;Right;Weights;20 reps   Extension Weight (lbs) 1   Horizontal ABduction 1 AROM;Right;10 reps   Horizontal ABduction 1 Limitations Bent   Shoulder Exercises: Sidelying   External Rotation Strengthening;Right;20 reps;Weights   External Rotation Weight (lbs) 1   Flexion Strengthening;Right;20 reps;Weights   Flexion Weight (lbs) 1   Shoulder Exercises: Standing   Protraction Strengthening;Right;Theraband  3x10 reps   Theraband Level (Shoulder Protraction) Level 1 (Yellow)   External Rotation Strengthening;Right;Theraband  3x10 reps   Theraband Level (Shoulder External Rotation) Level 1 (Yellow)   Internal Rotation Strengthening;Right;Theraband  3x10 reps   Theraband Level (Shoulder  Internal Rotation) Level 1 (Yellow)   Flexion Strengthening;Right;20 reps;Weights   Shoulder Flexion Weight (lbs) 1   Extension Strengthening;Right;Theraband  3x10 reps   Theraband Level (Shoulder Extension) Level 1 (Yellow)   Row Strengthening;Right;Theraband  3x10 reps   Theraband Level (Shoulder Row) Level 1 (Yellow)   Other Standing Exercises RUE wall ladder x3 min   Shoulder Exercises: Pulleys   Flexion 3 minutes   Other Pulley Exercises UE Ranger flex/circles CW and CCW x20 reps each   Shoulder Exercises: ROM/Strengthening   Rhythmic Stabilization, Supine R shoulder flex at 90 deg , ER at 45 deg and 90 deg x12 min                   PT Short Term Goals - 08/16/15 1410    PT SHORT TERM GOAL #1   Title Pt to verbalize understanding of protecting healing tissues to prevent reinjury.   Time 2   Period Weeks   Status Achieved   PT SHORT TERM GOAL #2   Title I with HEP   Time 4   Period Weeks   Status Achieved   PT SHORT TERM GOAL #3   Title decrease pain by 50%   Time 4   Period Weeks   Status Achieved           PT Long Term Goals - 06/18/15 1429    PT LONG TERM GOAL #1   Title I with advanced HEP   Time 8   Period Weeks   Status On-going   PT LONG TERM GOAL #2   Title improve Rt shoulder flex/abd to 150 or greater to perform ADLS   Time 8   Period Weeks   Status On-going   PT LONG TERM GOAL #3   Title Improve right shoulder ER to 70 degrees or greater and IR within functional limits   Time 8   Period Weeks   Status On-going   PT LONG TERM GOAL #4   Title demo shoulder strength WFL for ADLS   Time 8   Period Weeks   Status On-going   PT LONG TERM GOAL #5   Title report pain 2/10 or less in right shoulder   Time 8   Period Weeks   Status On-going               Plan - 08/27/15 1428    Clinical Impression Statement Patient tolerated today's treatment fairly well and continues to do well with initial strengthening exercises. Patient requested to attempt R horizontal abduction and noted increased weakness with exercise. Reported no increased pain with any exercise today. Continues to improve with R shoulder rhythmic stabilizations. Accepted new HEP with yellow theraband for home with no questions. Experienced R shoulder "feeling good" following today's treatment.   Pt will benefit from skilled therapeutic intervention in order to improve on the following deficits Decreased range of motion;Decreased safety awareness;Impaired UE functional use;Pain;Decreased strength;Increased edema;Postural dysfunction   Rehab Potential Excellent   PT Frequency 3x / week   PT  Duration 4 weeks   PT Treatment/Interventions ADLs/Self Care Home Management;Cryotherapy;Electrical Stimulation;Ultrasound;Patient/family education;Neuromuscular re-education;Therapeutic exercise;Manual techniques;Scar mobilization;Passive range of motion;Vasopneumatic Device   PT Next Visit Plan Progress to AAROM- AROM and PREs of R shoulder per MD order and MPT POC.  Please view new order under Media tab.   Consulted and Agree with Plan of Care Patient        Problem List Patient Active Problem List   Diagnosis Date Noted  .  TIA (transient ischemic attack) 12/15/2012  . Numbness of extremity 12/15/2012  . Tobacco abuse 05/19/2012  . Pneumonia 05/19/2012  . Kidney stones   . History of cervical fracture   . Hyperlipidemia   . HEARING LOSS, LEFT EAR 04/16/2009  . TESTICULAR MASS, LEFT 04/16/2009  . SLEEP APNEA 04/16/2009    Evelene Croon, PTA 08/27/2015, 2:31 PM  [facility] [address] [place], [place], [address] Phone: [phone]   Fax:  [phone]  Name: Brett Glover MRN: [id number] Date of Birth: [birth year]

## 2015-08-30 ENCOUNTER — Encounter: Payer: Self-pay | Admitting: Physical Therapy

## 2015-08-30 ENCOUNTER — Ambulatory Visit: Payer: Worker's Compensation | Admitting: Physical Therapy

## 2015-08-30 DIAGNOSIS — M25511 Pain in right shoulder: Secondary | ICD-10-CM

## 2015-08-30 DIAGNOSIS — R531 Weakness: Secondary | ICD-10-CM

## 2015-08-30 DIAGNOSIS — M25611 Stiffness of right shoulder, not elsewhere classified: Secondary | ICD-10-CM

## 2015-08-30 NOTE — Therapy (Signed)
[facility] [address] [place], [place], [address] Phone: [phone]   Fax:  [phone]  Physical Therapy Treatment  Patient Details  Name: Brett Glover MRN: [id number] Date of Birth: [birth year] Referring Provider: Dr. Ranell Patrick  Encounter Date: 08/30/2015      PT End of Session - 08/30/15 1348    Visit Number 21   Number of Visits 28   Date for PT Re-Evaluation 09/06/15   PT Start Time 1344   PT Stop Time 1426   PT Time Calculation (min) 42 min   Activity Tolerance Patient tolerated treatment well   Behavior During Therapy [facility] for tasks assessed/performed      Past Medical History  Diagnosis Date  . Chicken pox as a child  . Broken jaw (HCC) 1983  . Kidney stones 1990  . Tobacco abuse 05/19/2012  . History of cervical fracture   . Hyperlipidemia   . Cervical vertebral closed fracture (HCC)   . Elevated cholesterol     Past Surgical History  Procedure Laterality Date  . Fusion in neck  2004  . Fatty tissue removed  2007    left abdominal wall, lipoma  . Kidney stones removed  1990  . Cervical fusion      There were no vitals filed for this visit.  Visit Diagnosis:  Stiffness of right shoulder joint  Weakness  Pain in right shoulder      Subjective Assessment - 08/30/15 1347    Subjective Reports that R shoulder is "good." Only reports a little discomfort but no pain. Requested to work on abduction secondary to limited ROM and need for job.   Pertinent History see above   Patient Stated Goals get full motion and strength   Currently in Pain? No/denies            [facility] PT Assessment - 08/30/15 0001    Assessment   Medical Diagnosis s/p Rt RC surgery   Onset Date/Surgical Date 05/28/15   Hand Dominance Right   Next MD Visit 09/06/2015   Precautions   Precautions Shoulder   Type of Shoulder Precautions RCR   Precaution Comments Pt reported that Dr. Ranell Patrick told him to do seated active ER  (hitchhiker)                     [facility] Adult PT Treatment/Exercise - 08/30/15 0001    Shoulder Exercises: Supine   Protraction Strengthening;Right;20 reps;Weights   Protraction Weight (lbs) 2   Shoulder Exercises: Seated   Other Seated Exercises Seated R abduction ball roll x30 reps   Shoulder Exercises: Prone   Retraction Strengthening;Right;Weights  3x10 reps   Retraction Weight (lbs) 2   Retraction Limitations Bent   Extension Strengthening;Right;Weights;20 reps   Extension Weight (lbs) 2   Shoulder Exercises: Sidelying   External Rotation Strengthening;Right;Weights;15 reps   External Rotation Weight (lbs) 2   Flexion Strengthening;Right;Weights  3x10 reps   Flexion Weight (lbs) 2   Shoulder Exercises: Standing   Protraction Strengthening;Right;Theraband  3x10 reps   Theraband Level (Shoulder Protraction) Level 1 (Yellow)   External Rotation Strengthening;Right;Theraband  3x10 reps   Theraband Level (Shoulder External Rotation) Level 1 (Yellow)   Internal Rotation Strengthening;Right;Theraband  3x10 reps   Theraband Level (Shoulder Internal Rotation) Level 1 (Yellow)   Flexion Strengthening;Right;20 reps;Weights   Shoulder Flexion Weight (lbs) 2   Extension Strengthening;Right;Theraband  3x10 reps   Theraband Level (Shoulder Extension) Level 1 (Yellow)   Row Strengthening;Right;Theraband  3x10  reps   Theraband Level (Shoulder Row) Level 1 (Yellow)   Other Standing Exercises RUE wall ladder flexionx3 min, abduction x1.5 min   Shoulder Exercises: Pulleys   Flexion 3 minutes   ABduction 3 minutes   Other Pulley Exercises UE Ranger flex/circles CW and CCW x20 reps each   Shoulder Exercises: ROM/Strengthening   Other ROM/Strengthening Exercises R shoulder ABCs 1# x1;    Other ROM/Strengthening Exercises R shoulder 1# circles, triangles, squares x10 reps   Shoulder Exercises: Body Blade   Flexion 60 seconds;2 reps   ABduction 60 seconds;2 reps                   PT Short Term Goals - 08/16/15 1410    PT SHORT TERM GOAL #1   Title Pt to verbalize understanding of protecting healing tissues to prevent reinjury.   Time 2   Period Weeks   Status Achieved   PT SHORT TERM GOAL #2   Title I with HEP   Time 4   Period Weeks   Status Achieved   PT SHORT TERM GOAL #3   Title decrease pain by 50%   Time 4   Period Weeks   Status Achieved           PT Long Term Goals - 06/18/15 1429    PT LONG TERM GOAL #1   Title I with advanced HEP   Time 8   Period Weeks   Status On-going   PT LONG TERM GOAL #2   Title improve Rt shoulder flex/abd to 150 or greater to perform ADLS   Time 8   Period Weeks   Status On-going   PT LONG TERM GOAL #3   Title Improve right shoulder ER to 70 degrees or greater and IR within functional limits   Time 8   Period Weeks   Status On-going   PT LONG TERM GOAL #4   Title demo shoulder strength WFL for ADLS   Time 8   Period Weeks   Status On-going   PT LONG TERM GOAL #5   Title report pain 2/10 or less in right shoulder   Time 8   Period Weeks   Status On-going               Plan - 08/30/15 1432    Clinical Impression Statement Patient tolerated today's treatment well with the increase in resistance and did fairly well with activities in abduction to assist in work related activities. All therapeutic exercises were completed in correct technique with minimal multimodal cueing. Patient states that he experiences tightness in his R lateral neck with pulley in abduction. LT goals remain on-going secondary to R shoulder ROM, strength deficits. Demonstrates good R shoulder stability with the different stability exercises that were completed today with 1# weights. Denied R shoulder discomfort and pain following today's treatment.   Pt will benefit from skilled therapeutic intervention in order to improve on the following deficits Decreased range of motion;Decreased safety  awareness;Impaired UE functional use;Pain;Decreased strength;Increased edema;Postural dysfunction   Rehab Potential Excellent   PT Frequency 3x / week   PT Duration 4 weeks   PT Treatment/Interventions ADLs/Self Care Home Management;Cryotherapy;Electrical Stimulation;Ultrasound;Patient/family education;Neuromuscular re-education;Therapeutic exercise;Manual techniques;Scar mobilization;Passive range of motion;Vasopneumatic Device   PT Next Visit Plan Progress to AAROM- AROM and PREs of R shoulder per MD order and MPT POC.  Please view new order under Media tab.   Consulted and Agree with Plan of Care Patient  Problem List Patient Active Problem List   Diagnosis Date Noted  . TIA (transient ischemic attack) 12/15/2012  . Numbness of extremity 12/15/2012  . Tobacco abuse 05/19/2012  . Pneumonia 05/19/2012  . Kidney stones   . History of cervical fracture   . Hyperlipidemia   . HEARING LOSS, LEFT EAR 04/16/2009  . TESTICULAR MASS, LEFT 04/16/2009  . SLEEP APNEA 04/16/2009    Evelene Croon, PTA 08/30/2015, 2:37 PM  [facility] [address] [place], [place], [address] Phone: [phone]   Fax:  [phone]  Name: STALIN GRUENBERG MRN: [id number] Date of Birth: 05-25-1966

## 2015-09-03 ENCOUNTER — Encounter: Payer: Self-pay | Admitting: Physical Therapy

## 2015-09-03 ENCOUNTER — Ambulatory Visit: Payer: Worker's Compensation | Admitting: Physical Therapy

## 2015-09-03 DIAGNOSIS — R531 Weakness: Secondary | ICD-10-CM

## 2015-09-03 DIAGNOSIS — M25611 Stiffness of right shoulder, not elsewhere classified: Secondary | ICD-10-CM | POA: Diagnosis not present

## 2015-09-03 DIAGNOSIS — M25511 Pain in right shoulder: Secondary | ICD-10-CM

## 2015-09-03 NOTE — Therapy (Signed)
[facility] [address] [place], [place], [address] Phone: [phone]   Fax:  [phone]  Physical Therapy Treatment  Patient Details  Name: Brett Glover MRN: [id number] Date of Birth: 07-26-1966 Referring Provider: Dr. Ranell Patrick  Encounter Date: 09/03/2015      PT End of Session - 09/03/15 1351    Visit Number 22   Number of Visits 28   Date for PT Re-Evaluation 09/06/15   PT Start Time 1347   PT Stop Time 1428   PT Time Calculation (min) 41 min   Activity Tolerance Patient tolerated treatment well   Behavior During Therapy [facility] for tasks assessed/performed      Past Medical History  Diagnosis Date  . Chicken pox as a child  . Broken jaw (HCC) 1983  . Kidney stones 1990  . Tobacco abuse 05/19/2012  . History of cervical fracture   . Hyperlipidemia   . Cervical vertebral closed fracture (HCC)   . Elevated cholesterol     Past Surgical History  Procedure Laterality Date  . Fusion in neck  2004  . Fatty tissue removed  2007    left abdominal wall, lipoma  . Kidney stones removed  1990  . Cervical fusion      There were no vitals filed for this visit.  Visit Diagnosis:  Stiffness of right shoulder joint  Weakness  Pain in right shoulder      Subjective Assessment - 09/03/15 1349    Subjective Reports that he has an MD appt 09/06/2015. Reports that he thinks his shoulder will have numbness from now on. Reports that he has discomfort but no pain.    Pertinent History see above   Patient Stated Goals get full motion and strength   Currently in Pain? No/denies            [facility] PT Assessment - 09/03/15 0001    Assessment   Medical Diagnosis s/p Rt RC surgery   Onset Date/Surgical Date 05/28/15   Hand Dominance Right   Next MD Visit 09/06/2015   Precautions   Precautions Shoulder   Type of Shoulder Precautions RCR   Precaution Comments Pt reported that Dr. Ranell Patrick told him to do seated active ER  (hitchhiker)   AROM   Overall AROM  Deficits   AROM Assessment Site Shoulder   Right/Left Shoulder Right   Right Shoulder Flexion 120 Degrees   Right Shoulder ABduction 105 Degrees  scaption   Right Shoulder Internal Rotation 66 Degrees   Right Shoulder External Rotation 45 Degrees                     [facility] Adult PT Treatment/Exercise - 09/03/15 0001    Shoulder Exercises: Supine   Protraction Strengthening;Right;Weights  3x10 reps   Protraction Weight (lbs) 2   Shoulder Exercises: Prone   Retraction Strengthening;Right;Weights  3x10 reps   Retraction Weight (lbs) 2   Retraction Limitations Bent   Extension Strengthening;Right;Weights  3x10 reps   Extension Weight (lbs) 2   Shoulder Exercises: Sidelying   External Rotation Strengthening;Right;Weights  x30 reps   External Rotation Weight (lbs) 2   Flexion Strengthening;Right;Weights  3x10 reps   Flexion Weight (lbs) 2   Shoulder Exercises: Standing   Protraction Strengthening;Right;Theraband  3x10 reps   Theraband Level (Shoulder Protraction) Level 2 (Red)   External Rotation Strengthening;Right;Theraband  3x10 reps   Theraband Level (Shoulder External Rotation) Level 2 (Red)   Internal Rotation Strengthening;Right;Theraband  3x10 reps  Theraband Level (Shoulder Internal Rotation) Level 2 (Red)   Flexion Strengthening;Right;Weights  3x10 reps   Shoulder Flexion Weight (lbs) 2   Extension Strengthening;Right;Theraband  3x10 reps   Theraband Level (Shoulder Extension) Level 2 (Red)   Row Strengthening;Right;Theraband  3x10 reps   Theraband Level (Shoulder Row) Level 2 (Red)   Other Standing Exercises RUE wall ladder flexionx3 min   Other Standing Exercises R shoulder scaption 1# x20 reps   Shoulder Exercises: Pulleys   Flexion 3 minutes   Other Pulley Exercises UE Ranger flex/circles CW and CCW x20 reps each   Shoulder Exercises: ROM/Strengthening   Rhythmic Stabilization, Supine R shoulder flex at  45, 60 and 90 deg , ER at 45 deg and 90 deg x10 min   Other ROM/Strengthening Exercises R shoulder ABCs 2# x1;    Other ROM/Strengthening Exercises R shoulder 2# circles, triangles, squares x10 reps each                  PT Short Term Goals - 08/16/15 1410    PT SHORT TERM GOAL #1   Title Pt to verbalize understanding of protecting healing tissues to prevent reinjury.   Time 2   Period Weeks   Status Achieved   PT SHORT TERM GOAL #2   Title I with HEP   Time 4   Period Weeks   Status Achieved   PT SHORT TERM GOAL #3   Title decrease pain by 50%   Time 4   Period Weeks   Status Achieved           PT Long Term Goals - 06/18/15 1429    PT LONG TERM GOAL #1   Title I with advanced HEP   Time 8   Period Weeks   Status On-going   PT LONG TERM GOAL #2   Title improve Rt shoulder flex/abd to 150 or greater to perform ADLS   Time 8   Period Weeks   Status On-going   PT LONG TERM GOAL #3   Title Improve right shoulder ER to 70 degrees or greater and IR within functional limits   Time 8   Period Weeks   Status On-going   PT LONG TERM GOAL #4   Title demo shoulder strength WFL for ADLS   Time 8   Period Weeks   Status On-going   PT LONG TERM GOAL #5   Title report pain 2/10 or less in right shoulder   Time 8   Period Weeks   Status On-going               Plan - 09/03/15 1441    Clinical Impression Statement Patient tolerated today's treatment well with the increase in resistance in some the exercises that were directed. All therapeutic exercises were completed with minimal multimodal cueing for correct exercise technique. Overpressure required to R UT and cervical paraspinals during R shoulder flexion with 2# due to patient report of tightness. Patient reported experiencing tightness in anterior R shoulder with rhythmic stabilizations at 60 deg flexion in supine. AROM measurements against gravity taken today were 120 deg flexion, 105 deg scaption, 45 deg  ER, 66 deg IR. R shoulder ER measurement has decreased by 5 degrees since the last time it was measured in supine. Stability in R shoulder has improved following many sessions of rhythmic stabilizations in supine for ER, flexion. Continues to have difficulty with R shoulder scaption and weight had to be decreased to 1# for exercise today. Patient  continues to worry regarding abduction ROM for work related activities.    Pt will benefit from skilled therapeutic intervention in order to improve on the following deficits Decreased range of motion;Decreased safety awareness;Impaired UE functional use;Pain;Decreased strength;Increased edema;Postural dysfunction   Rehab Potential Excellent   PT Frequency 3x / week   PT Duration 4 weeks   PT Treatment/Interventions ADLs/Self Care Home Management;Cryotherapy;Electrical Stimulation;Ultrasound;Patient/family education;Neuromuscular re-education;Therapeutic exercise;Manual techniques;Scar mobilization;Passive range of motion;Vasopneumatic Device   PT Next Visit Plan Progress to AAROM- AROM and PREs of R shoulder per MD order and MPT POC.  Please view new order under Media tab.   Consulted and Agree with Plan of Care Patient        Problem List Patient Active Problem List   Diagnosis Date Noted  . TIA (transient ischemic attack) 12/15/2012  . Numbness of extremity 12/15/2012  . Tobacco abuse 05/19/2012  . Pneumonia 05/19/2012  . Kidney stones   . History of cervical fracture   . Hyperlipidemia   . HEARING LOSS, LEFT EAR 04/16/2009  . TESTICULAR MASS, LEFT 04/16/2009  . SLEEP APNEA 04/16/2009   Italyhad Applegate MPT Florence CannerKelsey Parsons, [place]PTA 09/03/2015 3:12 PM  [facility] [address] [place], [place], [address] Phone: [phone]   Fax:  [phone]  Name: Brett DykeRichard J Etzler MRN: [id number] Date of Birth: 01/08/1966

## 2015-09-06 ENCOUNTER — Encounter: Payer: Self-pay | Admitting: Physical Therapy

## 2015-09-10 ENCOUNTER — Encounter: Payer: Self-pay | Admitting: Physical Therapy

## 2015-09-10 ENCOUNTER — Ambulatory Visit: Payer: Worker's Compensation | Admitting: Physical Therapy

## 2015-09-10 DIAGNOSIS — M25511 Pain in right shoulder: Secondary | ICD-10-CM

## 2015-09-10 DIAGNOSIS — M25611 Stiffness of right shoulder, not elsewhere classified: Secondary | ICD-10-CM | POA: Diagnosis not present

## 2015-09-10 DIAGNOSIS — R531 Weakness: Secondary | ICD-10-CM

## 2015-09-10 NOTE — Therapy (Signed)
[facility] [address] [place], [place], [address] Phone: [phone]   Fax:  [phone]  Physical Therapy Treatment  Patient Details  Name: Brett Glover MRN: [id number] Date of Birth: 1966/01/15 Referring Provider: Dr. Ranell Patrick  Encounter Date: 09/10/2015      PT End of Session - 09/10/15 1356    Visit Number 23   Number of Visits 36   Date for PT Re-Evaluation 10/08/15  NO signed 09/06/2015, recieved 09/10/2015 for 2x/week for 4 weeks   PT Start Time 1347   PT Stop Time 1432   PT Time Calculation (min) 45 min   Activity Tolerance Patient tolerated treatment well   Behavior During Therapy [facility] for tasks assessed/performed      Past Medical History  Diagnosis Date  . Chicken pox as a child  . Broken jaw (HCC) 1983  . Kidney stones 1990  . Tobacco abuse 05/19/2012  . History of cervical fracture   . Hyperlipidemia   . Cervical vertebral closed fracture (HCC)   . Elevated cholesterol     Past Surgical History  Procedure Laterality Date  . Fusion in neck  2004  . Fatty tissue removed  2007    left abdominal wall, lipoma  . Kidney stones removed  1990  . Cervical fusion      There were no vitals filed for this visit.  Visit Diagnosis:  Stiffness of right shoulder joint  Weakness  Pain in right shoulder      Subjective Assessment - 09/10/15 1353    Subjective Reports that his shoulder "feels good." Requests that we are honest with next note to MD regarding his readiness for work conditioning.   Pertinent History see above   Patient Stated Goals get full motion and strength   Currently in Pain? No/denies            [facility] PT Assessment - 09/10/15 0001    Assessment   Medical Diagnosis s/p Rt RC surgery   Onset Date/Surgical Date 05/28/15   Hand Dominance Right   Next MD Visit 10/04/2015   Precautions   Precautions Shoulder   Type of Shoulder Precautions RCR   Precaution Comments Pt reported that Dr.  Ranell Patrick told him to do seated active ER (hitchhiker)                     [facility] Adult PT Treatment/Exercise - 09/10/15 0001    Shoulder Exercises: Prone   Retraction Strengthening;Right;15 reps;Weights  1 set 15 reps 1#, 1 set 15 reps 2#   Retraction Weight (lbs) 2   Flexion AROM;Right;15 reps   Extension Strengthening;Right;15 reps;Weights;Other (comment)  1 set 15 reps 1#, 1 set 15 reps 2#   Extension Weight (lbs) 2   Horizontal ABduction 1 Strengthening;Right;15 reps;Weights  Short lever arm with R elbow at 90 deg   Horizontal ABduction 1 Weight (lbs) 1   Other Prone Exercises R scaption 1# x30 reps   Other Prone Exercises R shoulder row with ER no weight x5 reps AROM, x15 reps AAROM   Shoulder Exercises: Sidelying   External Rotation Strengthening;Right;Weights  x30 reps   External Rotation Weight (lbs) 2   Shoulder Exercises: Standing   Protraction Strengthening;Right;Theraband  x30 reps   Theraband Level (Shoulder Protraction) Level 2 (Red)   External Rotation Strengthening;Right;Theraband  x30 reps   Theraband Level (Shoulder External Rotation) Level 2 (Red)   Internal Rotation Strengthening;Right;Theraband  x30 reps   Theraband Level (Shoulder Internal Rotation) Level  2 (Red)   Flexion Strengthening;Right;Weights   Shoulder Flexion Weight (lbs) 1  2# initially and 1# to promote proper mechanics   Extension Strengthening;Right;Theraband  x30 reps   Theraband Level (Shoulder Extension) Level 2 (Red)   Row Strengthening;Right;Theraband  x30 reps   Theraband Level (Shoulder Row) Level 2 (Red)   Other Standing Exercises RUE wall ladder flexionx3 min   Shoulder Exercises: Pulleys   Flexion 3 minutes   Other Pulley Exercises UE Ranger flex/circles CW and CCW x20 reps each                PT Education - 09/10/15 1432    Education provided Yes   Education Details HEP- D2 PNF, prone flexion, ext, row, ER   Person(s) Educated Patient   Methods  Explanation;Demonstration;Verbal cues;Handout   Comprehension Verbalized understanding;Returned demonstration;Verbal cues required          PT Short Term Goals - 08/16/15 1410    PT SHORT TERM GOAL #1   Title Pt to verbalize understanding of protecting healing tissues to prevent reinjury.   Time 2   Period Weeks   Status Achieved   PT SHORT TERM GOAL #2   Title I with HEP   Time 4   Period Weeks   Status Achieved   PT SHORT TERM GOAL #3   Title decrease pain by 50%   Time 4   Period Weeks   Status Achieved           PT Long Term Goals - 09/10/15 1358    PT LONG TERM GOAL #1   Title I with advanced HEP   Time 8   Period Weeks   Status On-going   PT LONG TERM GOAL #2   Title improve Rt shoulder flex/abd to 150 or greater to perform ADLS   Time 8   Period Weeks   Status On-going   PT LONG TERM GOAL #3   Title Improve right shoulder ER to 70 degrees or greater and IR within functional limits   Time 8   Period Weeks   Status On-going   PT LONG TERM GOAL #4   Title demo shoulder strength WFL for ADLS   Time 8   Period Weeks   Status On-going   PT LONG TERM GOAL #5   Title report pain 2/10 or less in right shoulder   Time 8   Period Weeks   Status Achieved               Plan - 09/10/15 1538    Clinical Impression Statement Patient tolerated today's treatment fairly well with the focus being on scapular strengthening to minimize compensatory strategies in antigravity exercises. R scapular instabilities were noted during R shoulder flexion in standing when patient continued to experience pulling in R cervical paraspinals region and did not experience the sensation in L shoulder flexion. Hand weights were adjusted to assist in decreasing the compensatory strategies. Focus was placed on scapular strengthening with less weight or no weight. Required mod A +1 for prone short lever arm horizontal abduction with ER with the ER portion secondary to weakness.  Demonstrated R scapular weakness throughout the prone exercises. Accepted new scapular strengthening HEP without any questions verbalized by patient. Experienced R shoulder fatigue following today's treatment.   Pt will benefit from skilled therapeutic intervention in order to improve on the following deficits Decreased range of motion;Decreased safety awareness;Impaired UE functional use;Pain;Decreased strength;Increased edema;Postural dysfunction   Rehab Potential Excellent   PT  Frequency 3x / week   PT Duration 4 weeks   PT Treatment/Interventions ADLs/Self Care Home Management;Cryotherapy;Electrical Stimulation;Ultrasound;Patient/family education;Neuromuscular re-education;Therapeutic exercise;Manual techniques;Scar mobilization;Passive range of motion;Vasopneumatic Device   PT Next Visit Plan Progress strengthening and observe mechanics per MPT POC.   Consulted and Agree with Plan of Care Patient        Problem List Patient Active Problem List   Diagnosis Date Noted  . TIA (transient ischemic attack) 12/15/2012  . Numbness of extremity 12/15/2012  . Tobacco abuse 05/19/2012  . Pneumonia 05/19/2012  . Kidney stones   . History of cervical fracture   . Hyperlipidemia   . HEARING LOSS, LEFT EAR 04/16/2009  . TESTICULAR MASS, LEFT 04/16/2009  . SLEEP APNEA 04/16/2009    Evelene CroonKelsey M Parsons, PTA 09/10/2015, 3:55 PM  [facility] [address] [place], [place], [address] Phone: [phone]   Fax:  [phone]  Name: Brett Glover MRN: [id number] Date of Birth: 1966-07-01

## 2015-09-10 NOTE — Patient Instructions (Signed)
Progressive Resisted: Extension (Prone)    Holding 1 can of soup or 2 lb hand weight, arms back, raise arms from floor, keeping elbows straight. Repeat _10___ times per set. Do _2-3___ sets per session. Do _2-3___ sessions per day.  http://orth.exer.us/872   Copyright  VHI. All rights reserved.  Progressive Resisted: External Rotation (Side-Lying)    Holding 1 can of soup or 2 lb hand weight, towel under arm, raise right forearm toward ceiling. Keep elbow bent and at side. Repeat _10___ times per set. Do __2-3__ sets per session. Do _2-3___ sessions per day.  http://orth.exer.us/878   Copyright  VHI. All rights reserved.  PNF Strengthening: Resisted    Standing with resistive band around each hand, bring right arm up and away, thumb back. Repeat _10___ times per set. Do __2-3__ sets per session. Do _2-3___ sessions per day.  http://orth.exer.us/918   Copyright  VHI. All rights reserved.  Scapular: Flexion (Prone)    Holding 1 can of soup or no weight, raise both arms forward. Keep elbows straight. Repeat __10__ times per set. Do _2-3___ sets per session. Do _2-3___ sessions per day.  http://orth.exer.us/860   Copyright  VHI. All rights reserved.

## 2015-09-17 ENCOUNTER — Encounter: Payer: Self-pay | Admitting: Physical Therapy

## 2015-09-17 ENCOUNTER — Ambulatory Visit: Payer: Worker's Compensation | Admitting: Physical Therapy

## 2015-09-17 DIAGNOSIS — M25611 Stiffness of right shoulder, not elsewhere classified: Secondary | ICD-10-CM

## 2015-09-17 DIAGNOSIS — R531 Weakness: Secondary | ICD-10-CM

## 2015-09-17 DIAGNOSIS — M25511 Pain in right shoulder: Secondary | ICD-10-CM

## 2015-09-17 NOTE — Therapy (Signed)
[facility] [address] [place], [place], [address] Phone: [phone]   Fax:  [phone]  Physical Therapy Treatment  Patient Details  Name: Brett DykeRichard J Glover MRN: [id number] Date of Birth: 11/15/1965 Referring Provider: Dr. Ranell PatrickNorris  Encounter Date: 09/17/2015      PT End of Session - 09/17/15 1355    Visit Number 24   Number of Visits 36   Date for PT Re-Evaluation 10/08/15   PT Start Time 1353   PT Stop Time 1431   PT Time Calculation (min) 38 min   Activity Tolerance Patient tolerated treatment well   Behavior During Therapy [facility] for tasks assessed/performed      Past Medical History  Diagnosis Date  . Chicken pox as a child  . Broken jaw (HCC) 1983  . Kidney stones 1990  . Tobacco abuse 05/19/2012  . History of cervical fracture   . Hyperlipidemia   . Cervical vertebral closed fracture (HCC)   . Elevated cholesterol     Past Surgical History  Procedure Laterality Date  . Fusion in neck  2004  . Fatty tissue removed  2007    left abdominal wall, lipoma  . Kidney stones removed  1990  . Cervical fusion      There were no vitals filed for this visit.  Visit Diagnosis:  Stiffness of right shoulder joint  Weakness  Pain in right shoulder      Subjective Assessment - 09/17/15 1354    Subjective Reports that he couldn't hardly move R shoulder following previous treatment. Requested no pulleys secondary to not worried about flexion.    Pertinent History see above   Patient Stated Goals get full motion and strength   Currently in Pain? No/denies  Reports that he isn't hurting only tender            [facility] PT Assessment - 09/17/15 0001    Assessment   Medical Diagnosis s/p Rt RC surgery   Onset Date/Surgical Date 05/28/15   Hand Dominance Right   Next MD Visit 10/04/2015   Precautions   Precautions Shoulder   Type of Shoulder Precautions RCR   Precaution Comments Pt reported that Dr. Ranell PatrickNorris told him to  do seated active ER (hitchhiker)                     [facility]/Exercise - 09/17/15 0001    Shoulder Exercises: Supine   Protraction Strengthening;Right;Weights  3x10 reps   Protraction Weight (lbs) 2   Other Supine Exercises R shoulder D2 AROM x15 reps, yellow theraband x15 reps   Shoulder Exercises: Prone   Retraction Strengthening;Right;Weights  3x10 reps with 2#; reported pull in R cervical paraspinals   Retraction Weight (lbs) 2   Flexion AROM;Right;15 reps;Left   Extension Strengthening;Right;Weights;20 reps   Extension Weight (lbs) 1   Horizontal ABduction 1 Strengthening;Right;20 reps;Weights   Horizontal ABduction 1 Weight (lbs) 2   Other Prone Exercises R scaption AROM x5 reps   Other Prone Exercises AAROM R shoulde row with assist for ER x20 reps   Shoulder Exercises: Sidelying   External Rotation Strengthening;Right;Weights  3x10 reps   External Rotation Weight (lbs) 2   Internal Rotation Strengthening;Right;Weights  3x10 reps   Internal Rotation Weight (lbs) 2   Shoulder Exercises: Standing   Protraction Strengthening;Right;Theraband  3x10 reps   Theraband Level (Shoulder Protraction) Level 2 (Red)   External Rotation Strengthening;Right;Theraband  3x10 reps   Theraband Level (Shoulder External Rotation) Level  2 (Red)   Internal Rotation Strengthening;Right;Theraband  3x10 reps   Theraband Level (Shoulder Internal Rotation) Level 2 (Red)   Extension Strengthening;Right;Theraband  3x10 reps   Theraband Level (Shoulder Extension) Level 2 (Red)   Row Strengthening;Right;Theraband  3x10 reps   Theraband Level (Shoulder Row) Level 2 (Red)   Other Standing Exercises R shoulder rolls 3# 3x10 reps   Shoulder Exercises: ROM/Strengthening   Wall Pushups 15 reps;Other (comment)  Pushup plus for scapular strengthening   Other ROM/Strengthening Exercises R shoulder standing ER circles at wall x2 min                  PT Short Term  Goals - 08/16/15 1410    PT SHORT TERM GOAL #1   Title Pt to verbalize understanding of protecting healing tissues to prevent reinjury.   Time 2   Period Weeks   Status Achieved   PT SHORT TERM GOAL #2   Title I with HEP   Time 4   Period Weeks   Status Achieved   PT SHORT TERM GOAL #3   Title decrease pain by 50%   Time 4   Period Weeks   Status Achieved           PT Long Term Goals - 09/10/15 1358    PT LONG TERM GOAL #1   Title I with advanced HEP   Time 8   Period Weeks   Status On-going   PT LONG TERM GOAL #2   Title improve Rt shoulder flex/abd to 150 or greater to perform ADLS   Time 8   Period Weeks   Status On-going   PT LONG TERM GOAL #3   Title Improve right shoulder ER to 70 degrees or greater and IR within functional limits   Time 8   Period Weeks   Status On-going   PT LONG TERM GOAL #4   Title demo shoulder strength WFL for ADLS   Time 8   Period Weeks   Status On-going   PT LONG TERM GOAL #5   Title report pain 2/10 or less in right shoulder   Time 8   Period Weeks   Status Achieved               Plan - 09/17/15 1438    Clinical Impression Statement Patient continues to tolerate R scapular strengthening treatment fairly well although he continues to demonstrate R scapular weakness and pain. Patient experienced the most difficulty with R shoulder row with ER in which patient continues to require min assist to advance into ER in prone. Required increased multimodal cueing for proper technique of pushup plus for technique and to evenly distribute weight during pushup. Was given red theraband to continue standing shoulder exercises and was educated that he may begin yellow theraband with D2 PNF at home if no pain experienced. Gave no numerical pain rating following today's treatment.   Pt will benefit from skilled therapeutic intervention in order to improve on the following deficits Decreased range of motion;Decreased safety awareness;Impaired  UE functional use;Pain;Decreased strength;Increased edema;Postural dysfunction   Rehab Potential Excellent   PT Frequency 3x / week   PT Duration 4 weeks   PT Treatment/Interventions ADLs/Self Care Home Management;Cryotherapy;Electrical Stimulation;Ultrasound;Patient/family education;Neuromuscular re-education;Therapeutic exercise;Manual techniques;Scar mobilization;Passive range of motion;Vasopneumatic Device   PT Next Visit Plan Progress strengthening and observe mechanics per MD orders.   Consulted and Agree with Plan of Care Patient        Problem List Patient Active  Problem List   Diagnosis Date Noted  . TIA (transient ischemic attack) 12/15/2012  . Numbness of extremity 12/15/2012  . Tobacco abuse 05/19/2012  . Pneumonia 05/19/2012  . Kidney stones   . History of cervical fracture   . Hyperlipidemia   . HEARING LOSS, LEFT EAR 04/16/2009  . TESTICULAR MASS, LEFT 04/16/2009  . SLEEP APNEA 04/16/2009    Evelene Croon, PTA 09/17/2015, 2:49 PM  [facility] [address] [place], [place], [address] Phone: [phone]   Fax:  [phone]  Name: RHODES CALVERT MRN: [id number] Date of Birth: [birth year]

## 2015-09-20 ENCOUNTER — Ambulatory Visit: Payer: Worker's Compensation | Attending: Orthopedic Surgery | Admitting: Physical Therapy

## 2015-09-20 ENCOUNTER — Encounter: Payer: Self-pay | Admitting: Physical Therapy

## 2015-09-20 DIAGNOSIS — M25511 Pain in right shoulder: Secondary | ICD-10-CM | POA: Insufficient documentation

## 2015-09-20 DIAGNOSIS — R531 Weakness: Secondary | ICD-10-CM | POA: Insufficient documentation

## 2015-09-20 DIAGNOSIS — M25611 Stiffness of right shoulder, not elsewhere classified: Secondary | ICD-10-CM | POA: Insufficient documentation

## 2015-09-20 NOTE — Therapy (Signed)
[facility] [address] [place], [place], [address] Phone: [phone]   Fax:  [phone]  Physical Therapy Treatment  Patient Details  Name: Brett DykeRichard J Balogh MRN: [id number] Date of Birth: [birth year] Referring Provider: Dr. Ranell PatrickNorris  Encounter Date: 09/20/2015      PT End of Session - 09/20/15 1354    Visit Number 25   Number of Visits 36   Date for PT Re-Evaluation 10/08/15   PT Start Time 1349   PT Stop Time 1429   PT Time Calculation (min) 40 min   Activity Tolerance Patient tolerated treatment well   Behavior During Therapy [facility] for tasks assessed/performed      Past Medical History  Diagnosis Date  . Chicken pox as a child  . Broken jaw (HCC) 1983  . Kidney stones 1990  . Tobacco abuse 05/19/2012  . History of cervical fracture   . Hyperlipidemia   . Cervical vertebral closed fracture (HCC)   . Elevated cholesterol     Past Surgical History  Procedure Laterality Date  . Fusion in neck  2004  . Fatty tissue removed  2007    left abdominal wall, lipoma  . Kidney stones removed  1990  . Cervical fusion      There were no vitals filed for this visit.  Visit Diagnosis:  Stiffness of right shoulder joint  Weakness  Pain in right shoulder      Subjective Assessment - 09/20/15 1353    Subjective "Its still attached."   Pertinent History see above   Patient Stated Goals get full motion and strength   Currently in Pain? Yes   Pain Score 2    Pain Location Shoulder   Pain Orientation Right   Pain Descriptors / Indicators Discomfort   Pain Type Surgical pain   Pain Onset 1 to 4 weeks ago   Pain Frequency Constant            [facility] PT Assessment - 09/20/15 0001    Assessment   Medical Diagnosis s/p Rt RC surgery   Onset Date/Surgical Date 05/28/15   Hand Dominance Right   Next MD Visit 10/04/2015                     [facility] Treatment/Exercise - 09/20/15 0001    Shoulder Exercises:  Supine   Protraction Strengthening;Right;Weights  x30 reps   Protraction Weight (lbs) 2   External Rotation Strengthening;Both;Theraband;Other (comment);20 reps  on vertical foam roller   Theraband Level (Shoulder External Rotation) Level 1 (Yellow)   Flexion Strengthening;Both;20 reps;Weights  on vertical foam roller   Shoulder Flexion Weight (lbs) 2   Other Supine Exercises R shoulder D2 yellow theraband x30 reps with vertical foam roller   Shoulder Exercises: Prone   Retraction Strengthening;Right;Weights  x30 reps   Retraction Weight (lbs) 2   Flexion AROM;Right;15 reps;Left   Extension Strengthening;Right;Weights  x30 reps   Extension Weight (lbs) 1   Horizontal ABduction 1 Strengthening;Right;Weights  x30 reps   Horizontal ABduction 1 Weight (lbs) 2   Other Prone Exercises R Upper Rhomboid strengthening 1# x30 reps   Shoulder Exercises: Sidelying   External Rotation Strengthening;Right;Weights  x30 reps   External Rotation Weight (lbs) 2   Internal Rotation Strengthening;Right;Weights  x30 reps   Internal Rotation Weight (lbs) 2   Shoulder Exercises: Standing   Protraction Strengthening;Right;Theraband  x30 reps   Theraband Level (Shoulder Protraction) Level 2 (Red)   External Rotation Strengthening;Right;Theraband  x30  reps   Theraband Level (Shoulder External Rotation) Level 2 (Red)   Internal Rotation Strengthening;Right;Theraband  x30 reps   Theraband Level (Shoulder Internal Rotation) Level 2 (Red)   Extension Strengthening;Right;Theraband  x30 reps   Theraband Level (Shoulder Extension) Level 2 (Red)   Other Standing Exercises B shoulder Lat pull down, high extension red band x30 reps each   Other Standing Exercises B shoulder walk red band x1 each way                  PT Short Term Goals - 08/16/15 1410    PT SHORT TERM GOAL #1   Title Pt to verbalize understanding of protecting healing tissues to prevent reinjury.   Time 2   Period Weeks    Status Achieved   PT SHORT TERM GOAL #2   Title I with HEP   Time 4   Period Weeks   Status Achieved   PT SHORT TERM GOAL #3   Title decrease pain by 50%   Time 4   Period Weeks   Status Achieved           PT Long Term Goals - 09/10/15 1358    PT LONG TERM GOAL #1   Title I with advanced HEP   Time 8   Period Weeks   Status On-going   PT LONG TERM GOAL #2   Title improve Rt shoulder flex/abd to 150 or greater to perform ADLS   Time 8   Period Weeks   Status On-going   PT LONG TERM GOAL #3   Title Improve right shoulder ER to 70 degrees or greater and IR within functional limits   Time 8   Period Weeks   Status On-going   PT LONG TERM GOAL #4   Title demo shoulder strength WFL for ADLS   Time 8   Period Weeks   Status On-going   PT LONG TERM GOAL #5   Title report pain 2/10 or less in right shoulder   Time 8   Period Weeks   Status Achieved               Plan - 09/20/15 1435    Clinical Impression Statement Patient tolerated today's treatment fairly well with new exercises with only complaint of discomfort with foam roller exercises. Patient continues to demonstrate R scapular stabilizer weakness with slight scapular winging noticed during B shoulder Lat pulldown. Patient imitated R shoulder horizontal abduction with ER while communicating with PTA  without being prompting in standing but reports continued difficulty with position in prone. Experienced 4-5/10 R shoulder discomfort following today's treatment.   Pt will benefit from skilled therapeutic intervention in order to improve on the following deficits Decreased range of motion;Decreased safety awareness;Impaired UE functional use;Pain;Decreased strength;Increased edema;Postural dysfunction   Rehab Potential Excellent   PT Frequency 3x / week   PT Duration 4 weeks   PT Treatment/Interventions ADLs/Self Care Home Management;Cryotherapy;Electrical Stimulation;Ultrasound;Patient/family  education;Neuromuscular re-education;Therapeutic exercise;Manual techniques;Scar mobilization;Passive range of motion;Vasopneumatic Device   PT Next Visit Plan Progress strengthening and observe mechanics per MD orders.   Consulted and Agree with Plan of Care Patient        Problem List Patient Active Problem List   Diagnosis Date Noted  . TIA (transient ischemic attack) 12/15/2012  . Numbness of extremity 12/15/2012  . Tobacco abuse 05/19/2012  . Pneumonia 05/19/2012  . Kidney stones   . History of cervical fracture   . Hyperlipidemia   . HEARING LOSS,  LEFT EAR 04/16/2009  . TESTICULAR MASS, LEFT 04/16/2009  . SLEEP APNEA 04/16/2009    Evelene Croon, PTA 09/20/2015, 2:46 PM  [facility] [address] [place], [place], [address] Phone: [phone]   Fax:  [phone]  Name: GAETAN SPIEKER MRN: [id number] Date of Birth: September 27, 1966

## 2015-09-24 ENCOUNTER — Encounter: Payer: Self-pay | Admitting: Physical Therapy

## 2015-09-24 ENCOUNTER — Ambulatory Visit: Payer: Worker's Compensation | Admitting: Physical Therapy

## 2015-09-24 DIAGNOSIS — M25611 Stiffness of right shoulder, not elsewhere classified: Secondary | ICD-10-CM | POA: Diagnosis not present

## 2015-09-24 DIAGNOSIS — M25511 Pain in right shoulder: Secondary | ICD-10-CM

## 2015-09-24 DIAGNOSIS — R531 Weakness: Secondary | ICD-10-CM

## 2015-09-24 NOTE — Therapy (Signed)
[facility] [address] [place], [place], [address] Phone: [phone]   Fax:  [phone]  Physical Therapy Treatment  Patient Details  Name: Brett Glover MRN: [id number] Date of Birth: [birth year] Referring Provider: Dr. Ranell Patrick  Encounter Date: 09/24/2015      PT End of Session - 09/24/15 1346    Visit Number 26   Number of Visits 36   Date for PT Re-Evaluation 10/08/15   PT Start Time 1346   PT Stop Time 1430   PT Time Calculation (min) 44 min   Activity Tolerance Patient tolerated treatment well   Behavior During Therapy [facility] for tasks assessed/performed      Past Medical History  Diagnosis Date  . Chicken pox as a child  . Broken jaw (HCC) 1983  . Kidney stones 1990  . Tobacco abuse 05/19/2012  . History of cervical fracture   . Hyperlipidemia   . Cervical vertebral closed fracture (HCC)   . Elevated cholesterol     Past Surgical History  Procedure Laterality Date  . Fusion in neck  2004  . Fatty tissue removed  2007    left abdominal wall, lipoma  . Kidney stones removed  1990  . Cervical fusion      There were no vitals filed for this visit.  Visit Diagnosis:  Stiffness of right shoulder joint  Weakness  Pain in right shoulder      Subjective Assessment - 09/24/15 1345    Subjective Woke up Friday night "screaming" but thinks that dog may have jumped on shoulder during sleep.   Pertinent History see above   Patient Stated Goals get full motion and strength   Currently in Pain? Yes   Pain Score 5    Pain Location Shoulder   Pain Orientation Right   Pain Descriptors / Indicators Other (Comment)  "irritable"   Pain Type Surgical pain   Pain Onset 1 to 4 weeks ago   Pain Frequency Constant            [facility] PT Assessment - 09/24/15 0001    Assessment   Medical Diagnosis s/p Rt RC surgery   Onset Date/Surgical Date 05/28/15   Hand Dominance Right   Next MD Visit 10/04/2015   Precautions   Precautions Shoulder   Type of Shoulder Precautions RCR   Precaution Comments Pt reported that Dr. Ranell Patrick told him to do seated active ER (hitchhiker)                     [facility] Adult PT Treatment/Exercise - 09/24/15 0001    Shoulder Exercises: Supine   Protraction Strengthening;Right;Weights  x30 reps   Protraction Weight (lbs) 3   External Rotation Strengthening;Both;20 reps;Theraband  on red ball   Theraband Level (Shoulder External Rotation) Level 1 (Yellow)   Flexion Strengthening;Both;20 reps;Weights  on red ball   Shoulder Flexion Weight (lbs) 2   Other Supine Exercises R shoulder flexion at 45 deg bolster x30  reps 1#, xfatigue 2#   Shoulder Exercises: Prone   Retraction Strengthening;Right;Weights  x30 reps   Retraction Weight (lbs) 2   Flexion AROM;Right;15 reps;Left   Extension Strengthening;Right;Weights  x30 reps   Extension Weight (lbs) 2   Horizontal ABduction 1 Strengthening;Right;Weights  x30 reps   Horizontal ABduction 1 Weight (lbs) 1   Other Prone Exercises R Upper Rhomboid strengthening 1# x30 reps   Shoulder Exercises: Sidelying   External Rotation Strengthening;Right;Weights  x30 reps   External Rotation Weight (  lbs) 2   Internal Rotation Strengthening;Right;Weights  x30 reps   Internal Rotation Weight (lbs) 2   Shoulder Exercises: Standing   Protraction Strengthening;Right;Theraband  x30 reps   Theraband Level (Shoulder Protraction) Level 2 (Red)   External Rotation Strengthening;Right;Theraband  x30 reps   Theraband Level (Shoulder External Rotation) Level 2 (Red)   Internal Rotation Strengthening;Right;Theraband  x30 reps   Theraband Level (Shoulder Internal Rotation) Level 2 (Red)   Extension Strengthening;Right;Theraband  x30 reps   Theraband Level (Shoulder Extension) Level 2 (Red)   Other Standing Exercises B shoulder Lat pull down, high extension, high row red band x30 reps each   Shoulder Exercises: Stretch   External  Rotation Stretch 3 reps;30 seconds  in standing at corner   Other Shoulder Stretches R Bicep stretch 3x30 reps                  PT Short Term Goals - 08/16/15 1410    PT SHORT TERM GOAL #1   Title Pt to verbalize understanding of protecting healing tissues to prevent reinjury.   Time 2   Period Weeks   Status Achieved   PT SHORT TERM GOAL #2   Title I with HEP   Time 4   Period Weeks   Status Achieved   PT SHORT TERM GOAL #3   Title decrease pain by 50%   Time 4   Period Weeks   Status Achieved           PT Long Term Goals - 09/10/15 1358    PT LONG TERM GOAL #1   Title I with advanced HEP   Time 8   Period Weeks   Status On-going   PT LONG TERM GOAL #2   Title improve Rt shoulder flex/abd to 150 or greater to perform ADLS   Time 8   Period Weeks   Status On-going   PT LONG TERM GOAL #3   Title Improve right shoulder ER to 70 degrees or greater and IR within functional limits   Time 8   Period Weeks   Status On-going   PT LONG TERM GOAL #4   Title demo shoulder strength WFL for ADLS   Time 8   Period Weeks   Status On-going   PT LONG TERM GOAL #5   Title report pain 2/10 or less in right shoulder   Time 8   Period Weeks   Status Achieved               Plan - 09/24/15 1429    Clinical Impression Statement Patient continues to have difficulty with R shoulder ROM against gravity and with R shoulder strength. R shoulder winging noted only in R shoulder rows in standing with continued R UT compensation secondary to weakness. Presented with R anterior shoulder tightness and weakness and R shoulder weakness in flexion in standing. R shoulder stretches were completed to efforts to decrease the tightness. Tightness and weakness not as predominate in 45 deg bolster with no weight or with hand weights. Discomfort present always in R shoulder per patient and PT staff shouldn't ask anymore due to constant presence of discomfort.   Pt will benefit from  skilled therapeutic intervention in order to improve on the following deficits Decreased range of motion;Decreased safety awareness;Impaired UE functional use;Pain;Decreased strength;Increased edema;Postural dysfunction   Rehab Potential Excellent   PT Frequency 3x / week   PT Duration 4 weeks   PT Treatment/Interventions ADLs/Self Care Home Management;Cryotherapy;Electrical Stimulation;Ultrasound;Patient/family education;Neuromuscular re-education;Therapeutic  exercise;Manual techniques;Scar mobilization;Passive range of motion;Vasopneumatic Device   PT Next Visit Plan Progress strengthening and observe mechanics per MD orders. Continue standing flexion with 45 deg bolster.   Consulted and Agree with Plan of Care Patient        Problem List Patient Active Problem List   Diagnosis Date Noted  . TIA (transient ischemic attack) 12/15/2012  . Numbness of extremity 12/15/2012  . Tobacco abuse 05/19/2012  . Pneumonia 05/19/2012  . Kidney stones   . History of cervical fracture   . Hyperlipidemia   . HEARING LOSS, LEFT EAR 04/16/2009  . TESTICULAR MASS, LEFT 04/16/2009  . SLEEP APNEA 04/16/2009    Evelene Croon, PTA 09/24/2015, 5:15 PM  [facility] [address] [place], [place], [address] Phone: [phone]   Fax:  [phone]  Name: NIRVAN LABAN MRN: [id number] Date of Birth: [birth year]

## 2015-09-27 ENCOUNTER — Ambulatory Visit: Payer: Worker's Compensation | Admitting: Physical Therapy

## 2015-09-27 ENCOUNTER — Encounter: Payer: Self-pay | Admitting: Physical Therapy

## 2015-09-27 DIAGNOSIS — M25611 Stiffness of right shoulder, not elsewhere classified: Secondary | ICD-10-CM

## 2015-09-27 DIAGNOSIS — M25511 Pain in right shoulder: Secondary | ICD-10-CM

## 2015-09-27 DIAGNOSIS — R531 Weakness: Secondary | ICD-10-CM

## 2015-09-27 NOTE — Therapy (Signed)
[facility] [address] [place], [place], [address] Phone: [phone]   Fax:  [phone]  Physical Therapy Treatment  Patient Details  Name: Brett Glover MRN: [id number] Date of Birth: [birth year] Referring Provider: Dr. Ranell PatrickNorris  Encounter Date: 09/27/2015      PT End of Session - 09/27/15 1355    Visit Number 27   Number of Visits 36   Date for PT Re-Evaluation 10/08/15   PT Start Time 1355   PT Stop Time 1432  2 units secondary to patient's late arrival   PT Time Calculation (min) 37 min   Activity Tolerance Patient tolerated treatment well   Behavior [facility] assessed/performed      Past Medical History  Diagnosis Date  . Chicken pox as a child  . Broken jaw (HCC) 1983  . Kidney stones 1990  . Tobacco abuse 05/19/2012  . History of cervical fracture   . Hyperlipidemia   . Cervical vertebral closed fracture (HCC)   . Elevated cholesterol     Past Surgical History  Procedure Laterality Date  . Fusion in neck  2004  . Fatty tissue removed  2007    left abdominal wall, lipoma  . Kidney stones removed  1990  . Cervical fusion      There were no vitals filed for this visit.  Visit Diagnosis:  Stiffness of right shoulder joint  Weakness  Pain in right shoulder      Subjective Assessment - 09/27/15 1354    Subjective "It doesn't feel good."   Pertinent History see above   Patient Stated Goals get full motion and strength   Currently in Pain? --  Patient previously stated that should not ask pain scale again due to constant discomfort            [facility] PT Assessment - 09/27/15 0001    Assessment   Medical Diagnosis s/p Rt RC surgery   Onset Date/Surgical Date 05/28/15   Hand Dominance Right   Next MD Visit 10/04/2015   Precautions   Precautions Shoulder   Type of Shoulder Precautions RCR   Precaution Comments Pt reported that Dr. Ranell PatrickNorris told him to do seated active ER  (hitchhiker)                     [facility] - 09/27/15 0001    Shoulder Exercises: Supine   Protraction Strengthening;Right;Weights  x30 reps   Protraction Weight (lbs) 3   Flexion Strengthening;Right;Weights;Other (comment)  x30 reps at 45 deg recline   Shoulder Flexion Weight (lbs) 2   Other Supine Exercises R shoulder scaption 1# x20 reps at 45 deg recline   Other Supine Exercises R shoulder D2 PNF strengthening yellow theraband at 45 deg recline x20 reps   Shoulder Exercises: Prone   Retraction Strengthening;Right;Weights  x30 reps   Retraction Weight (lbs) 2   Flexion AROM;Right;15 reps   Extension Strengthening;Right;Weights  2x15 reps   Extension Weight (lbs) 2   Horizontal ABduction 1 Strengthening;Right;Weights  2x10 reps   Horizontal ABduction 1 Weight (lbs) 1   Other Prone Exercises R Upper Rhomboid strengthening 1# x30 reps   Shoulder Exercises: Sidelying   External Rotation Strengthening;Right;Weights  x15 reps 2#, x15 reps 3#   External Rotation Weight (lbs) 3   Internal Rotation Strengthening;Right;Weights  x30 reps   Internal Rotation Weight (lbs) 3   Shoulder Exercises: Standing   Protraction Strengthening;Right;Theraband  x30 reps   Theraband  Level (Shoulder Protraction) Level 2 (Red)   External Rotation Strengthening;Right;Theraband  x30 reps   Theraband Level (Shoulder External Rotation) Level 2 (Red)   Internal Rotation Strengthening;Right;Theraband  x30 reps   Theraband Level (Shoulder Internal Rotation) Level 2 (Red)   Extension Strengthening;Right;Theraband  x30 reps   Theraband Level (Shoulder Extension) Level 2 (Red)   Other Standing Exercises B shoulder Lat pull down x30 reps, high row x10 reps   Shoulder Exercises: ROM/Strengthening   Wall Pushups 10 reps  pushup plus for scapular strengthening   Other ROM/Strengthening Exercises R shoulder ER with ball at wall x2 min; wall walks x2 RT with red theraband    Other ROM/Strengthening Exercises RUE bodyblade x1 min                  PT Short Term Goals - 08/16/15 1410    PT SHORT TERM GOAL #1   Title Pt to verbalize understanding of protecting healing tissues to prevent reinjury.   Time 2   Period Weeks   Status Achieved   PT SHORT TERM GOAL #2   Title I with HEP   Time 4   Period Weeks   Status Achieved   PT SHORT TERM GOAL #3   Title decrease pain by 50%   Time 4   Period Weeks   Status Achieved           PT Long Term Goals - 09/10/15 1358    PT LONG TERM GOAL #1   Title I with advanced HEP   Time 8   Period Weeks   Status On-going   PT LONG TERM GOAL #2   Title improve Rt shoulder flex/abd to 150 or greater to perform ADLS   Time 8   Period Weeks   Status On-going   PT LONG TERM GOAL #3   Title Improve right shoulder ER to 70 degrees or greater and IR within functional limits   Time 8   Period Weeks   Status On-going   PT LONG TERM GOAL #4   Title demo shoulder strength WFL for ADLS   Time 8   Period Weeks   Status On-going   PT LONG TERM GOAL #5   Title report pain 2/10 or less in right shoulder   Time 8   Period Weeks   Status Achieved               Plan - 09/27/15 1557    Clinical Impression Statement Patient tolerated today's treatment fairly well atlhough he continues to have slight R scapular winging present during lat pulldowns and rows. Demonstrated R UT compension during B shoulder high row secondary to weakness. Tolerated flexion and scaption strengthening better in 45 deg reclined positiion today. Noted fatigue in exercises in prone today with some reduced repititions secondary to fatigue.    Pt will benefit from skilled therapeutic intervention in order to improve on the following deficits Decreased range of motion;Decreased safety awareness;Impaired UE functional use;Pain;Decreased strength;Increased edema;Postural dysfunction   Rehab Potential Excellent   PT Frequency 3x / week    PT Duration 4 weeks   PT Treatment/Interventions ADLs/Self Care Home Management;Cryotherapy;Electrical Stimulation;Ultrasound;Patient/family education;Neuromuscular re-education;Therapeutic exercise;Manual techniques;Scar mobilization;Passive range of motion;Vasopneumatic Device   PT Next Visit Plan Progress strengthening and observe mechanics per MD orders. Continue standing flexion with 45 deg bolster.   Consulted and Agree with Plan of Care Patient        Problem List Patient Active Problem List   Diagnosis Date  Noted  . TIA (transient ischemic attack) 12/15/2012  . Numbness of extremity 12/15/2012  . Tobacco abuse 05/19/2012  . Pneumonia 05/19/2012  . Kidney stones   . History of cervical fracture   . Hyperlipidemia   . HEARING LOSS, LEFT EAR 04/16/2009  . TESTICULAR MASS, LEFT 04/16/2009  . SLEEP APNEA 04/16/2009    Evelene Croon, PTA 09/27/2015, 4:02 PM  [facility] [address] [place], [place], [address] Phone: [phone]   Fax:  [phone]  Name: Brett Glover MRN: [id number] Date of Birth: [birth year]

## 2015-10-01 ENCOUNTER — Ambulatory Visit: Payer: Worker's Compensation | Admitting: Physical Therapy

## 2015-10-01 ENCOUNTER — Encounter: Payer: Self-pay | Admitting: Physical Therapy

## 2015-10-01 DIAGNOSIS — R531 Weakness: Secondary | ICD-10-CM

## 2015-10-01 DIAGNOSIS — M25611 Stiffness of right shoulder, not elsewhere classified: Secondary | ICD-10-CM

## 2015-10-01 DIAGNOSIS — M25511 Pain in right shoulder: Secondary | ICD-10-CM

## 2015-10-01 NOTE — Therapy (Signed)
[facility] [address] [place], [place], [address] Phone: [phone]   Fax:  [phone]  Physical Therapy Treatment  Patient Details  Name: Brett Glover MRN: [id number] Date of Birth: 1966/03/21 Referring Provider: Dr. Ranell Patrick  Encounter Date: 10/01/2015      PT End of Session - 10/01/15 1416    Visit Number 28   Number of Visits 36   Date for PT Re-Evaluation 10/08/15   PT Start Time 1345   PT Stop Time 1430   PT Time Calculation (min) 45 min   Activity Tolerance Patient tolerated treatment well   Behavior During Therapy [facility] for tasks assessed/performed      Past Medical History  Diagnosis Date  . Chicken pox as a child  . Broken jaw (HCC) 1983  . Kidney stones 1990  . Tobacco abuse 05/19/2012  . History of cervical fracture   . Hyperlipidemia   . Cervical vertebral closed fracture (HCC)   . Elevated cholesterol     Past Surgical History  Procedure Laterality Date  . Fusion in neck  2004  . Fatty tissue removed  2007    left abdominal wall, lipoma  . Kidney stones removed  1990  . Cervical fusion      There were no vitals filed for this visit.  Visit Diagnosis:  Stiffness of right shoulder joint  Weakness  Pain in right shoulder      Subjective Assessment - 10/01/15 1413    Subjective Reports that R shoulder has been having muscle spasms a lot recently in posterior R shoulder. States that it was bad enough that he took a muscle relaxer 09/30/2015. Reports with any RUE activity that R armpit swells.   Pertinent History see above   Patient Stated Goals get full motion and strength   Currently in Pain? Other (Comment)  Patient has previously stated that pain scale should not be asked secondary to constant discomfort            [facility] PT Assessment - 10/01/15 0001    Assessment   Medical Diagnosis s/p Rt RC surgery   Onset Date/Surgical Date 05/28/15   Hand Dominance Right   Next MD Visit  10/04/2015   Precautions   Precautions Shoulder   Type of Shoulder Precautions RCR   Precaution Comments Pt reported that Dr. Ranell Patrick told him to do seated active ER (hitchhiker)   ROM / Strength   AROM / PROM / Strength AROM;Strength   AROM   Overall AROM  Deficits   AROM Assessment Site Shoulder   Right/Left Shoulder Right   Right Shoulder Flexion 102 Degrees  in sitting   Right Shoulder ABduction 70 Degrees  scaption in sitting   Right Shoulder Internal Rotation --  to abdomen in supine   Right Shoulder External Rotation 52 Degrees  in supine   Strength   Overall Strength Deficits   Strength Assessment Site Shoulder   Right/Left Shoulder Right   Right Shoulder Flexion 4-/5   Right Shoulder ABduction 4-/5   Right Shoulder Internal Rotation 4+/5   Right Shoulder External Rotation 4/5                     [facility] Adult PT Treatment/Exercise - 10/01/15 0001    Shoulder Exercises: Supine   Protraction Strengthening;Right;Weights  x30 reps   Protraction Weight (lbs) 3   External Rotation Strengthening;Both;Theraband  x30 reps at 45 deg recline   Theraband Level (Shoulder External Rotation) Level 2 (  Red)   Flexion Strengthening;Right;Weights;Other (comment)  3x10 reps at 45 deg recline   Shoulder Flexion Weight (lbs) 2   Other Supine Exercises R shoulder scaption 1# x20 reps at 45 deg recline   Shoulder Exercises: Prone   Retraction Strengthening;Right;Weights  x30 reps   Retraction Weight (lbs) 2   Retraction Limitations Bent   Extension Strengthening;Right;Weights  x30 reps; bent   Extension Weight (lbs) 2   Horizontal ABduction 1 Strengthening;Right;Weights;20 reps  R shoulder abducted with R elbow flexed downward   Horizontal ABduction 1 Weight (lbs) 1   Horizontal ABduction 1 Limitations Bent   Other Prone Exercises R Upper Rhomboid strengthening 1# x5 reps, 2# 2x10 reps  Bent   Shoulder Exercises: Sidelying   External Rotation  Strengthening;Right;Weights  x30 reps   External Rotation Weight (lbs) 3   Internal Rotation Strengthening;Right;Weights  x30 reps   Internal Rotation Weight (lbs) 3   Shoulder Exercises: Standing   Protraction Strengthening;Right;Theraband  3x10 reps   Theraband Level (Shoulder Protraction) Level 2 (Red)   External Rotation Strengthening;Right;Theraband  3x10 reps   Theraband Level (Shoulder External Rotation) Level 2 (Red)   Internal Rotation Strengthening;Right;Theraband  3x10 reps   Theraband Level (Shoulder Internal Rotation) Level 2 (Red)   Extension Strengthening;Right;Theraband  3x10 reps   Theraband Level (Shoulder Extension) Level 2 (Red)   Other Standing Exercises B shoulder Lat pull down x30 reps red theraband   Other Standing Exercises RUE wall slides x20 reps   Shoulder Exercises: ROM/Strengthening   Wall Pushups 15 reps;Other (comment)  Pushup plus for scapular strengthening                  PT Short Term Goals - 08/16/15 1410    PT SHORT TERM GOAL #1   Title Pt to verbalize understanding of protecting healing tissues to prevent reinjury.   Time 2   Period Weeks   Status Achieved   PT SHORT TERM GOAL #2   Title I with HEP   Time 4   Period Weeks   Status Achieved   PT SHORT TERM GOAL #3   Title decrease pain by 50%   Time 4   Period Weeks   Status Achieved           PT Long Term Goals - 10/01/15 1702    PT LONG TERM GOAL #1   Title I with advanced HEP   Time 8   Period Weeks   Status On-going   PT LONG TERM GOAL #2   Title improve Rt shoulder flex/abd to 150 or greater to perform ADLS   Time 8   Period Weeks   Status On-going  AROM R shoulder flex in sitting 102 deg   PT LONG TERM GOAL #3   Title Improve right shoulder ER to 70 degrees or greater and IR within functional limits   Time 8   Period Weeks   Status On-going  AROM R shoulder ER in supine 52 deg   PT LONG TERM GOAL #4   Title demo shoulder strength WFL for ADLS    Time 8   Period Weeks   Status On-going  R shoulder MMT in sitting ranging from 4+/5 to 4-/5   PT LONG TERM GOAL #5   Title report pain 2/10 or less in right shoulder   Time 8   Period Weeks   Status Achieved               Plan - 10/01/15 1707  Clinical Impression Statement Patient tolerated today's treatment fairly well although he continues to be very worried regarding end of PT and progressing to work conditioning repeatedly stating that he believes he isn't ready. Continues to have slight R scapular winging with lat pulldowns with red theraband and R UT compensation with wall pushup plus atlhough he was reminded to weightbear evenly. Does not demonstrate weakness with B shoulder flexion with handweight at 45 deg recline but upon sitting or standing patient demonstrates lack of full R shoulder ROM and decreased strength. Exercises previously completed in prone were completed in standing while hips flexed and resting on LUE as patient not in full gravity position to decrease difficulty. Patient had no previously made audible heavy breathing noises during sidelying ER with 3# but heavy breathing was noted today which patient attributed to muscle building. Patient's R shoulder ROM and strength assessed today did not achieve goals at this time. R shoulder scaption and flexion measurements for ROM and MMT were taken in short sitting. Patient has been completing R shoulder exercises with 1-2# handweights but has recently been able to progress to 3# hand weights with sidelying ER and IR as well as serratus punches. Patient had previously achieved LT goal of R shoulder pain less than 2/10 although he has recently stated that pain scale doesn't need to be asked at each treatment secondary to discomfort always being present.   Pt will benefit from skilled therapeutic intervention in order to improve on the following deficits Decreased range of motion;Decreased safety awareness;Impaired UE functional  use;Pain;Decreased strength;Increased edema;Postural dysfunction   Rehab Potential Excellent   PT Frequency 3x / week   PT Duration 4 weeks   PT Treatment/Interventions ADLs/Self Care Home Management;Cryotherapy;Electrical Stimulation;Ultrasound;Patient/family education;Neuromuscular re-education;Therapeutic exercise;Manual techniques;Scar mobilization;Passive range of motion;Vasopneumatic Device   PT Next Visit Plan Progress strengthening and observe mechanics per MD orders if necessary.   Consulted and Agree with Plan of Care Patient        Problem List Patient Active Problem List   Diagnosis Date Noted  . TIA (transient ischemic attack) 12/15/2012  . Numbness of extremity 12/15/2012  . Tobacco abuse 05/19/2012  . Pneumonia 05/19/2012  . Kidney stones   . History of cervical fracture   . Hyperlipidemia   . HEARING LOSS, LEFT EAR 04/16/2009  . TESTICULAR MASS, LEFT 04/16/2009  . SLEEP APNEA 04/16/2009    Florence Canner, PTA 10/01/2015 6:33 PM [place] MPT [facility] [address] [place], [place], [address] Phone: [phone]   Fax:  [phone]  Name: Brett Glover MRN: [id number] Date of Birth: 01-Jun-1966

## 2015-10-04 ENCOUNTER — Encounter: Payer: Self-pay | Admitting: Physical Therapy

## 2015-10-08 ENCOUNTER — Encounter: Payer: Self-pay | Admitting: Physical Therapy

## 2015-10-11 ENCOUNTER — Encounter: Payer: Self-pay | Admitting: Physical Therapy

## 2015-10-11 ENCOUNTER — Ambulatory Visit: Payer: Worker's Compensation | Admitting: Physical Therapy

## 2015-10-11 DIAGNOSIS — R531 Weakness: Secondary | ICD-10-CM

## 2015-10-11 DIAGNOSIS — M25611 Stiffness of right shoulder, not elsewhere classified: Secondary | ICD-10-CM

## 2015-10-11 DIAGNOSIS — M25511 Pain in right shoulder: Secondary | ICD-10-CM

## 2015-10-11 NOTE — Therapy (Signed)
[facility] [address] [place], [place], [address] Phone: [phone]   Fax:  [phone]  Physical Therapy Treatment  Patient Details  Name: Brett Glover MRN: [id number] Date of Birth: April 10, 1966 Referring Provider: Dr. Ranell Patrick  Encounter Date: 10/11/2015      PT End of Session - 10/11/15 1346    Visit Number 29   Number of Visits 36   Date for PT Re-Evaluation 10/08/15   PT Start Time 1343   PT Stop Time 1422   PT Time Calculation (min) 39 min   Activity Tolerance Patient tolerated treatment well   Behavior During Therapy [facility] for tasks assessed/performed      Past Medical History  Diagnosis Date  . Chicken pox as a child  . Broken jaw (HCC) 1983  . Kidney stones 1990  . Tobacco abuse 05/19/2012  . History of cervical fracture   . Hyperlipidemia   . Cervical vertebral closed fracture (HCC)   . Elevated cholesterol     Past Surgical History  Procedure Laterality Date  . Fusion in neck  2004  . Fatty tissue removed  2007    left abdominal wall, lipoma  . Kidney stones removed  1990  . Cervical fusion      There were no vitals filed for this visit.  Visit Diagnosis:  Stiffness of right shoulder joint  Weakness  Pain in right shoulder      Subjective Assessment - 10/11/15 1347    Subjective Reports that MD said to work on ROM and strengthening. 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   Pertinent History see above   Patient Stated Goals get full motion and strength   Currently in Pain? Other (Comment)  "Always discomfort."            [facility] PT Assessment - 10/11/15 0001    Assessment   Medical Diagnosis s/p Rt RC surgery   Onset Date/Surgical Date 05/28/15   Hand Dominance Right   Next MD Visit 11/01/2015   Precautions   Precautions Shoulder   Type of Shoulder Precautions RCR   Precaution Comments Pt reported that Dr. Ranell Patrick told him to do seated active ER (hitchhiker)   ROM /  Strength   AROM / PROM / Strength AROM   AROM   Overall AROM  Deficits   AROM Assessment Site Shoulder   Right/Left Shoulder Right   Right Shoulder Flexion 140 Degrees                     [facility] Adult PT Treatment/Exercise - 10/11/15 0001    Shoulder Exercises: Supine   Protraction Strengthening;Right;Weights  3x10 reps   Protraction Weight (lbs) 4   External Rotation Strengthening;Both;Theraband  3x10 reps   Theraband Level (Shoulder External Rotation) Level 2 (Red)   Flexion Strengthening;Right;Weights;Other (comment)  2x10 reps; 45 deg recline   Shoulder Flexion Weight (lbs) 3   Other Supine Exercises R shoulder scaption 2# x5 reps, x15 reps 1#   Other Supine Exercises R shoulder D2 PNF strengthening red theraband at 45 deg recline x7 reps   Shoulder Exercises: Prone   Retraction Strengthening;Right;Weights  1x10 reps 3#, 2x10 reps 4#   Retraction Weight (lbs) 4   Extension Strengthening;Right;Weights  3x10 reps    Extension Weight (lbs) 4   Horizontal ABduction 1 Strengthening;Right;Weights;15 reps  elbow abducted with flexion and rotated downward   Horizontal ABduction 1 Weight (lbs) 2   Other Prone Exercises R Upper Rhomboid  strengthening 3# 2x10 reps   Shoulder Exercises: Sidelying   External Rotation Strengthening;Right;Weights  2x10 reps 3#, 3x10 reps 4#   External Rotation Weight (lbs) 3   Internal Rotation Strengthening;Right;Weights  3x10 reps   Internal Rotation Weight (lbs) 4   Shoulder Exercises: Standing   Protraction Strengthening;Right;Theraband  3x10 reps   Theraband Level (Shoulder Protraction) Level 2 (Red)   External Rotation Strengthening;Right;Theraband  3x10 reps   Theraband Level (Shoulder External Rotation) Level 2 (Red)   Internal Rotation Strengthening;Right;Theraband  3x10 reps   Theraband Level (Shoulder Internal Rotation) Level 2 (Red)   Extension Strengthening;Right;Theraband  3x10 reps   Theraband Level (Shoulder  Extension) Level 2 (Red)   Row Strengthening;Right;Theraband  3x10 reps   Theraband Level (Shoulder Row) Level 2 (Red)   Other Standing Exercises B shoulder Lat pull down x30 reps red theraband   Shoulder Exercises: ROM/Strengthening   Wall Pushups 15 reps   Other ROM/Strengthening Exercises wall walks red theraband x2 RT                  PT Short Term Goals - 08/16/15 1410    PT SHORT TERM GOAL #1   Title Pt to verbalize understanding of protecting healing tissues to prevent reinjury.   Time 2   Period Weeks   Status Achieved   PT SHORT TERM GOAL #2   Title I with HEP   Time 4   Period Weeks   Status Achieved   PT SHORT TERM GOAL #3   Title decrease pain by 50%   Time 4   Period Weeks   Status Achieved           PT Long Term Goals - 10/11/15 1431    PT LONG TERM GOAL #1   Title I with advanced HEP   Time 8   Period Weeks   Status On-going   PT LONG TERM GOAL #2   Title improve Rt shoulder flex/abd to 150 or greater to perform ADLS   Time 8   Period Weeks   Status On-going  AROM R shoulder flex in standing 140 deg 10/11/2015   PT LONG TERM GOAL #3   Title Improve right shoulder ER to 70 degrees or greater and IR within functional limits   Time 8   Period Weeks   Status On-going  AROM R shoulder ER in supine 52 deg   PT LONG TERM GOAL #4   Title demo shoulder strength WFL for ADLS   Time 8   Period Weeks   Status On-going  R shoulder MMT in sitting ranging from 4+/5 to 4-/5   PT LONG TERM GOAL #5   Title report pain 2/10 or less in right shoulder   Time 8   Period Weeks   Status Achieved               Plan - 10/11/15 1423    Clinical Impression Statement Patient tolerated today's treatment well with only verbalizing muscle building fatigue with strength training today. AROM of R shoulder improved to 140 deg in standing from 102 deg in previous treatment when patient was very concerned regarding his ROM and facial expressions were noted  during flexion ROM. Strengthening was able to be progressed at patient's discretion not PTA's. No verbalizations of increased pain were communicated today during any of the exercises. Patient continues to report popping with R shoulder scaption in 45 deg recline. R shoulder scaption was attempted with 2# and patinet decresaed to 1#  due to popping although popping continued to experience popping per report. Patient has been practicing wall pushups and wall walks with red band at home without being prompted to or being given written HEP. Wall pushup form has greatly improved from previous attempts.    Pt will benefit from skilled therapeutic intervention in order to improve on the following deficits Decreased range of motion;Decreased safety awareness;Impaired UE functional use;Pain;Decreased strength;Increased edema;Postural dysfunction   Rehab Potential Excellent   PT Frequency 3x / week   PT Duration 4 weeks   PT Treatment/Interventions ADLs/Self Care Home Management;Cryotherapy;Electrical Stimulation;Ultrasound;Patient/family education;Neuromuscular re-education;Therapeutic exercise;Manual techniques;Scar mobilization;Passive range of motion;Vasopneumatic Device   PT Next Visit Plan Progress strengthening and observe mechanics per MD orders.   Consulted and Agree with Plan of Care Patient        Problem List Patient Active Problem List   Diagnosis Date Noted  . TIA (transient ischemic attack) 12/15/2012  . Numbness of extremity 12/15/2012  . Tobacco abuse 05/19/2012  . Pneumonia 05/19/2012  . Kidney stones   . History of cervical fracture   . Hyperlipidemia   . HEARING LOSS, LEFT EAR 04/16/2009  . TESTICULAR MASS, LEFT 04/16/2009  . SLEEP APNEA 04/16/2009    Evelene Croon, PTA 10/11/2015, 2:32 PM  [facility] [address] [place], [place], [address] Phone: [phone]   Fax:  [phone]  Name: Brett Glover MRN: [id number] Date  of Birth: 02-Oct-1966

## 2015-10-18 ENCOUNTER — Ambulatory Visit: Payer: Worker's Compensation | Admitting: Physical Therapy

## 2015-10-18 ENCOUNTER — Encounter: Payer: Self-pay | Admitting: Physical Therapy

## 2015-10-18 DIAGNOSIS — R531 Weakness: Secondary | ICD-10-CM

## 2015-10-18 DIAGNOSIS — M25611 Stiffness of right shoulder, not elsewhere classified: Secondary | ICD-10-CM

## 2015-10-18 DIAGNOSIS — M25511 Pain in right shoulder: Secondary | ICD-10-CM

## 2015-10-18 NOTE — Therapy (Signed)
[facility] [address] [place], [place], [address] Phone: [phone]   Fax:  [phone]  Physical Therapy Treatment  Patient Details  Name: Brett Glover MRN: [id number] Date of Birth: 09/29/1966 Referring Provider: Dr. Ranell PatrickNorris  Encounter Date: 10/18/2015      PT End of Session - 10/18/15 1407    Visit Number 30   Number of Visits 36   Date for PT Re-Evaluation 10/08/15   PT Start Time 1403   PT Stop Time 1429  2 units secondary to patient's 18 minute late arrival   PT Time Calculation (min) 26 min   Activity Tolerance Patient tolerated treatment well   Behavior During Therapy [facility] for tasks assessed/performed      Past Medical History  Diagnosis Date  . Chicken pox as a child  . Broken jaw (HCC) 1983  . Kidney stones 1990  . Tobacco abuse 05/19/2012  . History of cervical fracture   . Hyperlipidemia   . Cervical vertebral closed fracture (HCC)   . Elevated cholesterol     Past Surgical History  Procedure Laterality Date  . Fusion in neck  2004  . Fatty tissue removed  2007    left abdominal wall, lipoma  . Kidney stones removed  1990  . Cervical fusion      There were no vitals filed for this visit.  Visit Diagnosis:  Stiffness of right shoulder joint  Weakness  Pain in right shoulder      Subjective Assessment - 10/18/15 1406    Subjective Reports soreness in R shoulder to what patient thinks is muscle development.   Pertinent History see above   Patient Stated Goals get full motion and strength            [facility] - 10/18/15 0001    Assessment   Medical Diagnosis s/p Rt RC surgery   Onset Date/Surgical Date 05/28/15   Hand Dominance Right   Next MD Visit 11/01/2015   Precautions   Precautions Shoulder   Type of Shoulder Precautions RCR                     [facility] Adult PT Treatment/Exercise - 10/18/15 0001    Shoulder Exercises: Supine   Protraction  Strengthening;Right;Weights  x30 reps; 45 deg recline   Protraction Weight (lbs) 4   External Rotation Strengthening;Both;Theraband;20 reps   45 deg recline   Theraband Level (Shoulder External Rotation) Level 2 (Red)   Flexion Strengthening;Right;Weights;Other (comment)  x30 reps; 45 recline   Shoulder Flexion Weight (lbs) 3   Other Supine Exercises R shoulder scaption 45 deg recline 1# x15 reps   Other Supine Exercises R shoulder D2 PNF strengthening 3# at 45 deg recline x 20 reps   Shoulder Exercises: Prone   Retraction Strengthening;Right;Weights  X30 REPS   Retraction Weight (lbs) 4   Extension Strengthening;Right;Weights  X30 REPS   Extension Weight (lbs) 3   Horizontal ABduction 1 Strengthening;Right;Weights;15 reps   Horizontal ABduction 1 Weight (lbs) 2   Other Prone Exercises R Upper Rhomboid strengthening 2# 2x10 reps   Shoulder Exercises: Sidelying   External Rotation Strengthening;Right;Weights  x30 reps   External Rotation Weight (lbs) 3   Internal Rotation Strengthening;Right;Weights  x30 reps   Internal Rotation Weight (lbs) 4   Shoulder Exercises: Standing   Protraction Strengthening;Right;Theraband  x30 reps   Theraband Level (Shoulder Protraction) Level 3 (Green)   External Rotation Strengthening;Right;Theraband  x30 reps   Theraband Level (  Shoulder External Rotation) Level 2 (Red)   Internal Rotation Strengthening;Right;Theraband  x30 reps   Theraband Level (Shoulder Internal Rotation) Level 2 (Red)   Extension Strengthening;Right;Theraband  x30 reps   Theraband Level (Shoulder Extension) Level 2 (Red)   Row Strengthening;Right;Theraband  x30 reps   Theraband Level (Shoulder Row) Level 2 (Red)   Shoulder Exercises: ROM/Strengthening   Wall Pushups 15 reps   Other ROM/Strengthening Exercises wall walks green theraband x2 RT                  PT Short Term Goals - 08/16/15 1410    PT SHORT TERM GOAL #1   Title Pt to verbalize  understanding of protecting healing tissues to prevent reinjury.   Time 2   Period Weeks   Status Achieved   PT SHORT TERM GOAL #2   Title I with HEP   Time 4   Period Weeks   Status Achieved   PT SHORT TERM GOAL #3   Title decrease pain by 50%   Time 4   Period Weeks   Status Achieved           PT Long Term Goals - 10/11/15 1431    PT LONG TERM GOAL #1   Title I with advanced HEP   Time 8   Period Weeks   Status On-going   PT LONG TERM GOAL #2   Title improve Rt shoulder flex/abd to 150 or greater to perform ADLS   Time 8   Period Weeks   Status On-going  AROM R shoulder flex in standing 140 deg 10/11/2015   PT LONG TERM GOAL #3   Title Improve right shoulder ER to 70 degrees or greater and IR within functional limits   Time 8   Period Weeks   Status On-going  AROM R shoulder ER in supine 52 deg   PT LONG TERM GOAL #4   Title demo shoulder strength WFL for ADLS   Time 8   Period Weeks   Status On-going  R shoulder MMT in sitting ranging from 4+/5 to 4-/5   PT LONG TERM GOAL #5   Title report pain 2/10 or less in right shoulder   Time 8   Period Weeks   Status Achieved               Plan - 10/18/15 1431    Clinical Impression Statement Patient tolerated today's treatment well with only reports of soreness to which patient attributes to muscle building. Patient continues to experience R armpit inflammation per patient report. Completed all exercises with no additional cueing required for correction. Soreness limited some exercises in ability to improve regarding strengthening with hand weights.    Pt will benefit from skilled therapeutic intervention in order to improve on the following deficits Decreased range of motion;Decreased safety awareness;Impaired UE functional use;Pain;Decreased strength;Increased edema;Postural dysfunction   Rehab Potential Excellent   PT Frequency 3x / week   PT Duration 4 weeks   PT Treatment/Interventions ADLs/Self Care  Home Management;Cryotherapy;Electrical Stimulation;Ultrasound;Patient/family education;Neuromuscular re-education;Therapeutic exercise;Manual techniques;Scar mobilization;Passive range of motion;Vasopneumatic Device   PT Next Visit Plan Progress strengthening and observe mechanics per MD orders.   Consulted and Agree with Plan of Care Patient        Problem List Patient Active Problem List   Diagnosis Date Noted  . TIA (transient ischemic attack) 12/15/2012  . Numbness of extremity 12/15/2012  . Tobacco abuse 05/19/2012  . Pneumonia 05/19/2012  . Kidney stones   .  History of cervical fracture   . Hyperlipidemia   . HEARING LOSS, LEFT EAR 04/16/2009  . TESTICULAR MASS, LEFT 04/16/2009  . SLEEP APNEA 04/16/2009   Physical Therapy Progress Note  Dates of Reporting Period: 09/03/15 to 10/18/15  Objective Reports of Subjective Statement: Patient pleased with progress but does not feel ready to return to work at this time or to start work conditioning.  Objective Measurements: Please see above goal section for most recent measurements.  Goal Update: As above.  Plan: Continue with ROM and strengthening.  Reason Skilled Services are Required: Manual techniques to improve right shoulder strengthening and progressive resistive exercise progression.  Juvon Teater, [place] MPT 10/18/2015, 3:32 PM  [facility] [address] [place], [place], [address] Phone: [phone]   Fax:  [phone]  Name: Brett Glover MRN: [id number] Date of Birth: 12-15-1965

## 2015-10-18 NOTE — Therapy (Signed)
[facility] [address] [place], [place], [address] Phone: [phone]   Fax:  [phone]  Physical Therapy Treatment  Patient Details  Name: Brett DykeRichard J Sear MRN: [id number] Date of Birth: 09/29/1966 Referring Provider: Dr. Ranell PatrickNorris  Encounter Date: 10/18/2015      PT End of Session - 10/18/15 1407    Visit Number 30   Number of Visits 36   Date for PT Re-Evaluation 10/08/15   PT Start Time 1403   PT Stop Time 1429  2 units secondary to patient's 18 minute late arrival   PT Time Calculation (min) 26 min   Activity Tolerance Patient tolerated treatment well   Behavior During Therapy [facility] for tasks assessed/performed      Past Medical History  Diagnosis Date  . Chicken pox as a child  . Broken jaw (HCC) 1983  . Kidney stones 1990  . Tobacco abuse 05/19/2012  . History of cervical fracture   . Hyperlipidemia   . Cervical vertebral closed fracture (HCC)   . Elevated cholesterol     Past Surgical History  Procedure Laterality Date  . Fusion in neck  2004  . Fatty tissue removed  2007    left abdominal wall, lipoma  . Kidney stones removed  1990  . Cervical fusion      There were no vitals filed for this visit.  Visit Diagnosis:  Stiffness of right shoulder joint  Weakness  Pain in right shoulder      Subjective Assessment - 10/18/15 1406    Subjective Reports soreness in R shoulder to what patient thinks is muscle development.   Pertinent History see above   Patient Stated Goals get full motion and strength            [facility] - 10/18/15 0001    Assessment   Medical Diagnosis s/p Rt RC surgery   Onset Date/Surgical Date 05/28/15   Hand Dominance Right   Next MD Visit 11/01/2015   Precautions   Precautions Shoulder   Type of Shoulder Precautions RCR                     [facility] Adult PT Treatment/Exercise - 10/18/15 0001    Shoulder Exercises: Supine   Protraction  Strengthening;Right;Weights  x30 reps; 45 deg recline   Protraction Weight (lbs) 4   External Rotation Strengthening;Both;Theraband;20 reps   45 deg recline   Theraband Level (Shoulder External Rotation) Level 2 (Red)   Flexion Strengthening;Right;Weights;Other (comment)  x30 reps; 45 recline   Shoulder Flexion Weight (lbs) 3   Other Supine Exercises R shoulder scaption 45 deg recline 1# x15 reps   Other Supine Exercises R shoulder D2 PNF strengthening 3# at 45 deg recline x 20 reps   Shoulder Exercises: Prone   Retraction Strengthening;Right;Weights  X30 REPS   Retraction Weight (lbs) 4   Extension Strengthening;Right;Weights  X30 REPS   Extension Weight (lbs) 3   Horizontal ABduction 1 Strengthening;Right;Weights;15 reps   Horizontal ABduction 1 Weight (lbs) 2   Other Prone Exercises R Upper Rhomboid strengthening 2# 2x10 reps   Shoulder Exercises: Sidelying   External Rotation Strengthening;Right;Weights  x30 reps   External Rotation Weight (lbs) 3   Internal Rotation Strengthening;Right;Weights  x30 reps   Internal Rotation Weight (lbs) 4   Shoulder Exercises: Standing   Protraction Strengthening;Right;Theraband  x30 reps   Theraband Level (Shoulder Protraction) Level 3 (Green)   External Rotation Strengthening;Right;Theraband  x30 reps   Theraband Level (  Shoulder External Rotation) Level 2 (Red)   Internal Rotation Strengthening;Right;Theraband  x30 reps   Theraband Level (Shoulder Internal Rotation) Level 2 (Red)   Extension Strengthening;Right;Theraband  x30 reps   Theraband Level (Shoulder Extension) Level 2 (Red)   Row Strengthening;Right;Theraband  x30 reps   Theraband Level (Shoulder Row) Level 2 (Red)   Shoulder Exercises: ROM/Strengthening   Wall Pushups 15 reps   Other ROM/Strengthening Exercises wall walks green theraband x2 RT                  PT Short Term Goals - 08/16/15 1410    PT SHORT TERM GOAL #1   Title Pt to verbalize  understanding of protecting healing tissues to prevent reinjury.   Time 2   Period Weeks   Status Achieved   PT SHORT TERM GOAL #2   Title I with HEP   Time 4   Period Weeks   Status Achieved   PT SHORT TERM GOAL #3   Title decrease pain by 50%   Time 4   Period Weeks   Status Achieved           PT Long Term Goals - 10/11/15 1431    PT LONG TERM GOAL #1   Title I with advanced HEP   Time 8   Period Weeks   Status On-going   PT LONG TERM GOAL #2   Title improve Rt shoulder flex/abd to 150 or greater to perform ADLS   Time 8   Period Weeks   Status On-going  AROM R shoulder flex in standing 140 deg 10/11/2015   PT LONG TERM GOAL #3   Title Improve right shoulder ER to 70 degrees or greater and IR within functional limits   Time 8   Period Weeks   Status On-going  AROM R shoulder ER in supine 52 deg   PT LONG TERM GOAL #4   Title demo shoulder strength WFL for ADLS   Time 8   Period Weeks   Status On-going  R shoulder MMT in sitting ranging from 4+/5 to 4-/5   PT LONG TERM GOAL #5   Title report pain 2/10 or less in right shoulder   Time 8   Period Weeks   Status Achieved               Plan - 10/18/15 1431    Clinical Impression Statement Patient tolerated today's treatment well with only reports of soreness to which patient attributes to muscle building. Patient continues to experience R armpit inflammation per patient report. Completed all exercises with no additional cueing required for correction. Soreness limited some exercises in ability to improve regarding strengthening with hand weights.    Pt will benefit from skilled therapeutic intervention in order to improve on the following deficits Decreased range of motion;Decreased safety awareness;Impaired UE functional use;Pain;Decreased strength;Increased edema;Postural dysfunction   Rehab Potential Excellent   PT Frequency 3x / week   PT Duration 4 weeks   PT Treatment/Interventions ADLs/Self Care  Home Management;Cryotherapy;Electrical Stimulation;Ultrasound;Patient/family education;Neuromuscular re-education;Therapeutic exercise;Manual techniques;Scar mobilization;Passive range of motion;Vasopneumatic Device   PT Next Visit Plan Progress strengthening and observe mechanics per MD orders.   Consulted and Agree with Plan of Care Patient        Problem List Patient Active Problem List   Diagnosis Date Noted  . TIA (transient ischemic attack) 12/15/2012  . Numbness of extremity 12/15/2012  . Tobacco abuse 05/19/2012  . Pneumonia 05/19/2012  . Kidney stones   .  History of cervical fracture   . Hyperlipidemia   . HEARING LOSS, LEFT EAR 04/16/2009  . TESTICULAR MASS, LEFT 04/16/2009  . SLEEP APNEA 04/16/2009    Evelene CroonKelsey M Parsons, PTA 10/18/2015, 2:37 PM  [facility] [address] [place], [place], [address] Phone: [phone]   Fax:  [phone]  Name: Brett DykeRichard J Podesta MRN: [id number] Date of Birth: [birth year]

## 2015-10-25 ENCOUNTER — Encounter: Payer: Self-pay | Admitting: Physical Therapy

## 2015-10-29 ENCOUNTER — Encounter: Payer: Self-pay | Admitting: Physical Therapy

## 2015-11-01 ENCOUNTER — Ambulatory Visit: Payer: Worker's Compensation | Attending: Orthopedic Surgery | Admitting: Physical Therapy

## 2015-11-01 DIAGNOSIS — M25611 Stiffness of right shoulder, not elsewhere classified: Secondary | ICD-10-CM | POA: Insufficient documentation

## 2015-11-01 DIAGNOSIS — M25511 Pain in right shoulder: Secondary | ICD-10-CM | POA: Insufficient documentation

## 2015-11-01 DIAGNOSIS — R531 Weakness: Secondary | ICD-10-CM | POA: Insufficient documentation

## 2015-11-05 ENCOUNTER — Ambulatory Visit: Payer: Worker's Compensation | Admitting: Physical Therapy

## 2015-11-05 ENCOUNTER — Encounter: Payer: Self-pay | Admitting: Physical Therapy

## 2015-11-05 DIAGNOSIS — R531 Weakness: Secondary | ICD-10-CM

## 2015-11-05 DIAGNOSIS — M25511 Pain in right shoulder: Secondary | ICD-10-CM

## 2015-11-05 DIAGNOSIS — M25611 Stiffness of right shoulder, not elsewhere classified: Secondary | ICD-10-CM

## 2015-11-05 NOTE — Therapy (Addendum)
[facility] [address] [place], [place], [address] Phone: [phone]   Fax:  [phone]  Physical Therapy Treatment  Patient Details  Name: Brett Glover MRN: [id number] Date of Birth: 1966/02/08 Referring Provider: Dr. Ranell Patrick  Encounter Date: 11/05/2015      PT End of Session - 11/05/15 1402    Visit Number 31   Number of Visits 36   Date for PT Re-Evaluation 10/08/15   PT Start Time 1403   PT Stop Time 1411  1 unit secondary to patient late arrival/ request for only measurments   PT Time Calculation (min) 8 min   Activity Tolerance Other (comment)  Limited due to sickness   Behavior During Therapy [facility] for tasks assessed/performed      Past Medical History  Diagnosis Date  . Chicken pox as a child  . Broken jaw (HCC) 1983  . Kidney stones 1990  . Tobacco abuse 05/19/2012  . History of cervical fracture   . Hyperlipidemia   . Cervical vertebral closed fracture (HCC)   . Elevated cholesterol     Past Surgical History  Procedure Laterality Date  . Fusion in neck  2004  . Fatty tissue removed  2007    left abdominal wall, lipoma  . Kidney stones removed  1990  . Cervical fusion      There were no vitals filed for this visit.  Visit Diagnosis:  Stiffness of right shoulder joint  Weakness  Pain in right shoulder      Subjective Assessment - 11/05/15 1414    Subjective Patient requests only measurements today secondary to being sick with the flu. Reports that shoulder has been feeling tight due to not feeling like doing any exercises.   Pertinent History see above   Patient Stated Goals get full motion and strength   Currently in Pain? Other (Comment)  Previously stated that shoulder is always in discomfort            [facility] PT Assessment - 11/05/15 0001    Assessment   Medical Diagnosis s/p Rt RC surgery   Onset Date/Surgical Date 05/28/15   Hand Dominance Right   Next MD Visit 11/08/2015    Precautions   Precautions Shoulder   Type of Shoulder Precautions RCR   ROM / Strength   AROM / PROM / Strength AROM;Strength   AROM   Overall AROM  Deficits   AROM Assessment Site Shoulder   Right/Left Shoulder Right   Right Shoulder Flexion 142 Degrees   Right Shoulder ABduction 100 Degrees  scaption   Right Shoulder Internal Rotation 60 Degrees   Right Shoulder External Rotation 45 Degrees   Strength   Overall Strength Deficits;Within functional limits for tasks performed   Strength Assessment Site Shoulder   Right/Left Shoulder Right   Right Shoulder Flexion 4+/5   Right Shoulder ABduction 4+/5  scaption   Right Shoulder Internal Rotation 5/5   Right Shoulder External Rotation 5/5                               PT Short Term Goals - 08/16/15 1410    PT SHORT TERM GOAL #1   Title Pt to verbalize understanding of protecting healing tissues to prevent reinjury.   Time 2   Period Weeks   Status Achieved   PT SHORT TERM GOAL #2   Title I with HEP   Time 4   Period Weeks  Status Achieved   PT SHORT TERM GOAL #3   Title decrease pain by 50%   Time 4   Period Weeks   Status Achieved           PT Long Term Goals - 11/05/15 1421    PT LONG TERM GOAL #1   Title I with advanced HEP   Time 8   Period Weeks   Status On-going   PT LONG TERM GOAL #2   Title improve Rt shoulder flex/abd to 150 or greater to perform ADLS   Time 8   Period Weeks   Status On-going  AROM R shoulder flex in standing 142 deg 11/05/2015   PT LONG TERM GOAL #3   Title Improve right shoulder ER to 70 degrees or greater and IR within functional limits   Time 8   Period Weeks   Status On-going  AROM R shoulder ER in supine 45 deg 11/05/2015   PT LONG TERM GOAL #4   Title demo shoulder strength WFL for ADLS   Time 8   Period Weeks   Status On-going  R shoulder MMT in standing ranging from 4+/5 to 5/5 11/05/2015   PT LONG TERM GOAL #5   Title report pain 2/10 or less  in right shoulder   Time 8   Period Weeks   Status Achieved               Plan - 11/05/15 1418    Clinical Impression Statement Patient has not been to PT for over 2 weeks due to weather and patient sick with the flu. Patient arrived with mask to prevent spread of germs and requested only measurements secondary to not feeling well still. AROM R shoulder flexion and scaption measurements have improved but ER and IR measurments have decreased slightly. Strength in R shoulder has improved as well ranging from 4+/5 to 5/5 today.   Pt will benefit from skilled therapeutic intervention in order to improve on the following deficits Decreased range of motion;Decreased safety awareness;Impaired UE functional use;Pain;Decreased strength;Increased edema;Postural dysfunction   Rehab Potential Excellent   PT Frequency 3x / week   PT Duration 4 weeks   PT Treatment/Interventions ADLs/Self Care Home Management;Cryotherapy;Electrical Stimulation;Ultrasound;Patient/family education;Neuromuscular re-education;Therapeutic exercise;Manual techniques;Scar mobilization;Passive range of motion;Vasopneumatic Device   PT Next Visit Plan Progress strengthening and observe mechanics per MD orders.   Consulted and Agree with Plan of Care Patient        Problem List Patient Active Problem List   Diagnosis Date Noted  . TIA (transient ischemic attack) 12/15/2012  . Numbness of extremity 12/15/2012  . Tobacco abuse 05/19/2012  . Pneumonia 05/19/2012  . Kidney stones   . History of cervical fracture   . Hyperlipidemia   . HEARING LOSS, LEFT EAR 04/16/2009  . TESTICULAR MASS, LEFT 04/16/2009  . SLEEP APNEA 04/16/2009   Ahmed Prima, PTA 11/05/2015 6:07 PM Mali Applegate MPT [facility] [address] [place], [place], [address] Phone: [phone]   Fax:  [phone]  Name: Brett Glover MRN: [id number] Date of Birth: Mar 28, 1966  PHYSICAL THERAPY  DISCHARGE SUMMARY  Visits from Start of Care: 31.  Current functional level related to goals / functional outcomes: Please see above.   Remaining deficits: Continued right shoulder loss of ROM and strength.   Education / Equipment: HEP.  Plan: Patient agrees to discharge.  Patient goals were partially met. Patient is being discharged due to the physician's request.  ?????  Mali Applegate MPT

## 2015-11-08 ENCOUNTER — Ambulatory Visit: Payer: Worker's Compensation | Admitting: Physical Therapy

## 2015-11-12 ENCOUNTER — Ambulatory Visit: Payer: Worker's Compensation | Admitting: Physical Therapy

## 2015-11-15 ENCOUNTER — Encounter: Payer: Self-pay | Admitting: Physical Therapy

## 2024-04-10 ENCOUNTER — Observation Stay (HOSPITAL_COMMUNITY)

## 2024-04-10 ENCOUNTER — Emergency Department (HOSPITAL_COMMUNITY)

## 2024-04-10 ENCOUNTER — Observation Stay (HOSPITAL_COMMUNITY)
Admission: EM | Admit: 2024-04-10 | Discharge: 2024-04-12 | Disposition: A | Discharge status: Home / Self Care | Attending: General Surgery | Admitting: General Surgery

## 2024-04-10 ENCOUNTER — Other Ambulatory Visit: Payer: Self-pay

## 2024-04-10 ENCOUNTER — Encounter (HOSPITAL_COMMUNITY): Payer: Self-pay | Admitting: Emergency Medicine

## 2024-04-10 DIAGNOSIS — K8000 Calculus of gallbladder with acute cholecystitis without obstruction: Secondary | ICD-10-CM | POA: Diagnosis not present

## 2024-04-10 DIAGNOSIS — R739 Hyperglycemia, unspecified: Secondary | ICD-10-CM | POA: Diagnosis not present

## 2024-04-10 DIAGNOSIS — I2584 Coronary atherosclerosis due to calcified coronary lesion: Secondary | ICD-10-CM

## 2024-04-10 DIAGNOSIS — K8010 Calculus of gallbladder with chronic cholecystitis without obstruction: Secondary | ICD-10-CM | POA: Diagnosis not present

## 2024-04-10 DIAGNOSIS — I2510 Atherosclerotic heart disease of native coronary artery without angina pectoris: Secondary | ICD-10-CM | POA: Diagnosis not present

## 2024-04-10 DIAGNOSIS — R1011 Right upper quadrant pain: Secondary | ICD-10-CM | POA: Diagnosis present

## 2024-04-10 DIAGNOSIS — Z8673 Personal history of transient ischemic attack (TIA), and cerebral infarction without residual deficits: Secondary | ICD-10-CM | POA: Insufficient documentation

## 2024-04-10 DIAGNOSIS — F17210 Nicotine dependence, cigarettes, uncomplicated: Secondary | ICD-10-CM | POA: Diagnosis not present

## 2024-04-10 DIAGNOSIS — I739 Peripheral vascular disease, unspecified: Secondary | ICD-10-CM | POA: Diagnosis not present

## 2024-04-10 DIAGNOSIS — K8050 Calculus of bile duct without cholangitis or cholecystitis without obstruction: Secondary | ICD-10-CM | POA: Diagnosis not present

## 2024-04-10 LAB — COMPREHENSIVE METABOLIC PANEL WITH GFR
ALT: 30 U/L (ref 0–44)
AST: 18 U/L (ref 15–41)
Albumin: 3.6 g/dL (ref 3.5–5.0)
Alkaline Phosphatase: 80 U/L (ref 38–126)
Anion gap: 9 (ref 5–15)
BUN: 16 mg/dL (ref 6–20)
CO2: 26 mmol/L (ref 22–32)
Calcium: 8.4 mg/dL — ABNORMAL LOW (ref 8.9–10.3)
Chloride: 101 mmol/L (ref 98–111)
Creatinine, Ser: 0.94 mg/dL (ref 0.61–1.24)
GFR, Estimated: >60 mL/min (ref >60)
Glucose, Bld: 201 mg/dL — ABNORMAL HIGH (ref 70–99)
Potassium: 4.1 mmol/L (ref 3.5–5.1)
Sodium: 136 mmol/L (ref 135–145)
Total Bilirubin: 0.6 mg/dL (ref 0.0–1.2)
Total Protein: 6.3 g/dL — ABNORMAL LOW (ref 6.5–8.1)

## 2024-04-10 LAB — URINALYSIS, ROUTINE W REFLEX MICROSCOPIC
Bilirubin Urine: NEGATIVE
Glucose, UA: 150 mg/dL — AB
Hgb urine dipstick: NEGATIVE
Ketones, ur: NEGATIVE mg/dL
Leukocytes,Ua: NEGATIVE
Nitrite: NEGATIVE
Protein, ur: NEGATIVE mg/dL
Specific Gravity, Urine: >1.046 — ABNORMAL HIGH (ref 1.005–1.030)
pH: 6 (ref 5.0–8.0)

## 2024-04-10 LAB — GLUCOSE, CAPILLARY
Glucose-Capillary: 169 mg/dL — ABNORMAL HIGH (ref 70–99)
Glucose-Capillary: 127 mg/dL — ABNORMAL HIGH (ref 70–99)
Glucose-Capillary: 132 mg/dL — ABNORMAL HIGH (ref 70–99)
Glucose-Capillary: 110 mg/dL — ABNORMAL HIGH (ref 70–99)

## 2024-04-10 LAB — BASIC METABOLIC PANEL WITH GFR
Anion gap: 8 (ref 5–15)
BUN: 16 mg/dL (ref 6–20)
CO2: 24 mmol/L (ref 22–32)
Calcium: 8.4 mg/dL — ABNORMAL LOW (ref 8.9–10.3)
Chloride: 102 mmol/L (ref 98–111)
Creatinine, Ser: 0.89 mg/dL (ref 0.61–1.24)
GFR, Estimated: >60 mL/min (ref >60)
Glucose, Bld: 214 mg/dL — ABNORMAL HIGH (ref 70–99)
Potassium: 4.3 mmol/L (ref 3.5–5.1)
Sodium: 134 mmol/L — ABNORMAL LOW (ref 135–145)

## 2024-04-10 LAB — CBC
HCT: 41.1 % (ref 39.0–52.0)
HCT: 41.6 % (ref 39.0–52.0)
Hemoglobin: 13.9 g/dL (ref 13.0–17.0)
Hemoglobin: 13.7 g/dL (ref 13.0–17.0)
MCH: 30.2 pg (ref 26.0–34.0)
MCH: 29.6 pg (ref 26.0–34.0)
MCHC: 33.8 g/dL (ref 30.0–36.0)
MCHC: 32.9 g/dL (ref 30.0–36.0)
MCV: 89.2 fL (ref 80.0–100.0)
MCV: 89.8 fL (ref 80.0–100.0)
Platelets: 200 10*3/uL (ref 150–400)
Platelets: 203 10*3/uL (ref 150–400)
RBC: 4.61 MIL/uL (ref 4.22–5.81)
RBC: 4.63 MIL/uL (ref 4.22–5.81)
RDW: 13.2 % (ref 11.5–15.5)
RDW: 13.1 % (ref 11.5–15.5)
WBC: 12.8 10*3/uL — ABNORMAL HIGH (ref 4.0–10.5)
WBC: 11.1 10*3/uL — ABNORMAL HIGH (ref 4.0–10.5)
nRBC: 0 % (ref 0.0–0.2)
nRBC: 0 % (ref 0.0–0.2)

## 2024-04-10 LAB — HEPATIC FUNCTION PANEL
ALT: 33 U/L (ref 0–44)
AST: 22 U/L (ref 15–41)
Albumin: 3.6 g/dL (ref 3.5–5.0)
Alkaline Phosphatase: 77 U/L (ref 38–126)
Bilirubin, Direct: 0.1 mg/dL (ref 0.0–0.2)
Indirect Bilirubin: 0.6 mg/dL (ref 0.3–0.9)
Total Bilirubin: 0.7 mg/dL (ref 0.0–1.2)
Total Protein: 6.5 g/dL (ref 6.5–8.1)

## 2024-04-10 LAB — HEMOGLOBIN A1C
Hgb A1c MFr Bld: 6.8 % — ABNORMAL HIGH (ref 4.8–5.6)
Mean Plasma Glucose: 148.46 mg/dL

## 2024-04-10 LAB — MAGNESIUM: Magnesium: 2 mg/dL (ref 1.7–2.4)

## 2024-04-10 LAB — LIPASE, BLOOD: Lipase: 32 U/L (ref 11–51)

## 2024-04-10 LAB — PHOSPHORUS: Phosphorus: 3.1 mg/dL (ref 2.5–4.6)

## 2024-04-10 MED ORDER — PANTOPRAZOLE SODIUM 40 MG IV SOLR
40.0000 mg | Freq: Two times a day (BID) | INTRAVENOUS | Status: DC
  Administered 2024-04-10 – 2024-04-11 (×4): 40 mg via INTRAVENOUS
  Filled 2024-04-10 (×5): qty 10

## 2024-04-10 MED ORDER — OXYCODONE HCL 5 MG PO TABS: 5.0000 mg | ORAL_TABLET | ORAL | Status: DC | PRN

## 2024-04-10 MED ORDER — IOHEXOL 300 MG/ML  SOLN
100.0000 mL | Freq: Once | INTRAMUSCULAR | Status: AC | PRN
  Administered 2024-04-10: 100 mL via INTRAVENOUS

## 2024-04-10 MED ORDER — SODIUM CHLORIDE 0.9 % IV SOLN
2.0000 g | INTRAVENOUS | Status: DC
  Administered 2024-04-10 – 2024-04-12 (×3): 2 g via INTRAVENOUS
  Filled 2024-04-10 (×3): qty 20

## 2024-04-10 MED ORDER — OXYCODONE HCL 5 MG PO TABS: 2.5000 mg | ORAL_TABLET | ORAL | Status: DC | PRN

## 2024-04-10 MED ORDER — HYDROMORPHONE HCL 1 MG/ML IJ SOLN: 0.5000 mg | INTRAMUSCULAR | Status: DC | PRN

## 2024-04-10 MED ORDER — ACETAMINOPHEN 500 MG PO TABS
1000.0000 mg | ORAL_TABLET | Freq: Four times a day (QID) | ORAL | Status: DC | PRN
  Administered 2024-04-10: 1000 mg via ORAL
  Filled 2024-04-10: qty 2

## 2024-04-10 MED ORDER — MELATONIN 3 MG PO TABS: 6.0000 mg | ORAL_TABLET | Freq: Every evening | ORAL | Status: DC | PRN

## 2024-04-10 MED ORDER — LACTATED RINGERS IV BOLUS
1000.0000 mL | Freq: Once | INTRAVENOUS | Status: AC
  Administered 2024-04-10: 1000 mL via INTRAVENOUS

## 2024-04-10 MED ORDER — INSULIN ASPART 100 UNIT/ML IJ SOLN
0.0000 [IU] | Freq: Three times a day (TID) | INTRAMUSCULAR | Status: DC
  Administered 2024-04-10 – 2024-04-11 (×3): 1 [IU] via SUBCUTANEOUS
  Administered 2024-04-12: 2 [IU] via SUBCUTANEOUS

## 2024-04-10 MED ORDER — ONDANSETRON HCL 4 MG/2ML IJ SOLN: 4.0000 mg | Freq: Four times a day (QID) | INTRAMUSCULAR | Status: DC | PRN

## 2024-04-10 MED ORDER — ONDANSETRON HCL 4 MG/2ML IJ SOLN
4.0000 mg | Freq: Once | INTRAMUSCULAR | Status: AC
  Administered 2024-04-10: 4 mg via INTRAVENOUS
  Filled 2024-04-10: qty 2

## 2024-04-10 MED ORDER — METRONIDAZOLE 500 MG/100ML IV SOLN
500.0000 mg | Freq: Two times a day (BID) | INTRAVENOUS | Status: DC
  Administered 2024-04-10 – 2024-04-12 (×5): 500 mg via INTRAVENOUS
  Filled 2024-04-10 (×5): qty 100

## 2024-04-10 MED ORDER — MORPHINE SULFATE (PF) 4 MG/ML IV SOLN
4.0000 mg | Freq: Once | INTRAVENOUS | Status: AC
  Administered 2024-04-10: 4 mg via INTRAVENOUS
  Filled 2024-04-10: qty 1

## 2024-04-10 MED ORDER — ALBUTEROL SULFATE (2.5 MG/3ML) 0.083% IN NEBU: 2.5000 mg | INHALATION_SOLUTION | RESPIRATORY_TRACT | Status: DC | PRN

## 2024-04-10 MED ORDER — POLYETHYLENE GLYCOL 3350 17 G PO PACK: 17.0000 g | PACK | Freq: Every day | ORAL | Status: DC | PRN

## 2024-04-10 MED ORDER — SODIUM CHLORIDE 0.9% FLUSH
3.0000 mL | Freq: Two times a day (BID) | INTRAVENOUS | Status: DC
  Administered 2024-04-10 – 2024-04-11 (×4): 3 mL via INTRAVENOUS

## 2024-04-10 MED ORDER — LACTATED RINGERS IV SOLN: INTRAVENOUS | Status: AC

## 2024-04-10 NOTE — H&P (Signed)
 History and Physical    Brett Glover FMW:[id number] DOB: 09/16/1966 DOA: 04/10/2024  PCP: Patient, No Pcp Per   Patient coming from: Home    Chief Complaint:  Chief Complaint  Patient presents with   Abdominal Pain   Emesis    HPI:  Brett Glover is a 58 y.o. male with hx of remote TIA, hx smoking (recently quit vaping), HLD, who presents with acute onset of RUQ abd pain radiating to his back with associated N/V. Onset around 9-10 pm yesterday after eating dinner. Had a [place] T bone to eat. Emesis just regurgitated food. No blood in emesis / stool. Last BM this morning. He denies any prior episode of RUQ pain after eating, although said not like this, but then not really when asked to clarify further. Pain is severe and constant. Reports associated chills, no fever. No other recent illness. No prior abd surgeries. No hx gallbladder disease in the past.     Review of Systems:  ROS complete and negative except as marked above   No Known Allergies  Prior to Admission medications   Not on File    Past Medical History:  Diagnosis Date   Broken jaw (HCC) 1983   Cervical vertebral closed fracture (HCC)    Chicken pox as a child   Elevated cholesterol    History of cervical fracture    Hyperlipidemia    Kidney stones 1990   Tobacco abuse 05/19/2012    Past Surgical History:  Procedure Laterality Date   CERVICAL FUSION     fatty tissue removed  2007   left abdominal wall, lipoma   fusion in neck  2004   kidney stones removed  1990     reports that he has been smoking cigarettes. He has a 30 pack-year smoking history. He does not have any smokeless tobacco history on file. He reports current alcohol use. He reports that he does not use drugs.  Family History  Problem Relation Age of Onset   Seizures Mother    ADD / ADHD Daughter    Mental illness Daughter        bipolar, add   Heart disease Sister      Physical Exam: Vitals:   04/10/24 0105 04/10/24  0107 04/10/24 0109 04/10/24 0308  BP:   (!) 151/73 (!) 155/70  Pulse: 66  69 71  Resp: 20   15  Temp: 98.1 F (36.7 C)     TempSrc: Oral     SpO2: 93%  94% 90%  Weight:  117.9 kg    Height:  6' (1.829 m)      Gen: Awake, alert, NAD   CV: Regular, normal S1, S2, no murmurs  Resp: Normal WOB, CTAB  Abd: Round, nondistended, normoactive. There is moderate RUQ tenderness. No rebound, guarding, rigidity.  MSK: Symmetric, no edema  Skin: No rashes or lesions to exposed skin  Neuro: Alert and interactive  Psych: euthymic, appropriate    Data review:   Labs reviewed, notable for:   LFT, lipase wnl   Micro:  No results found for this or any previous visit.  Imaging reviewed:  CT ABDOMEN PELVIS W CONTRAST Addendum Date: 04/10/2024 ADDENDUM REPORT: 04/10/2024 02:42 ADDENDUM: Upon further review, a single 4 mm gallstone is seen within the gallbladder neck. The gallbladder is mildly distended, however, there is no associated gallbladder wall thickening or pericholecystic inflammatory stranding or fluid identified to suggest superimposed changes of acute cholecystitis. These results were discussed with managing  clinician, Roselyn, MD at time of addendum on 04/10/2024 at 2:41 am. Electronically Signed   By: Dorethia Molt M.D.   On: 04/10/2024 02:42   Result Date: 04/10/2024 CLINICAL DATA:  Epigastric abdominal pain, bloating, nausea, vomiting EXAM: CT ABDOMEN AND PELVIS WITH CONTRAST TECHNIQUE: Multidetector CT imaging of the abdomen and pelvis was performed using the standard protocol following bolus administration of intravenous contrast. RADIATION DOSE REDUCTION: This exam was performed according to the departmental dose-optimization program which includes automated exposure control, adjustment of the mA and/or kV according to patient size and/or use of iterative reconstruction technique. CONTRAST:  100mL OMNIPAQUE IOHEXOL 300 MG/ML  SOLN COMPARISON:  None Available. FINDINGS: Lower chest: No  acute abnormality. Extensive coronary artery calcification Hepatobiliary: No focal liver abnormality is seen. No gallstones, gallbladder wall thickening, or biliary dilatation. Pancreas: Unremarkable Spleen: Unremarkable Adrenals/Urinary Tract: Adrenal glands are unremarkable. Kidneys are normal, without renal calculi, focal lesion, or hydronephrosis. Bladder is unremarkable. Stomach/Bowel: Stomach is within normal limits. Appendix normal. No evidence of bowel wall thickening, distention, or inflammatory changes. Vascular/Lymphatic: Extensive mixed aortoiliac atherosclerotic plaque with chronic occlusion of the right common iliac artery. Greater than 50% stenosis of the left common iliac artery origin. Calcified hemodynamically significant stenoses of the external iliac artery origins. High-grade calcified stenosis of the internal iliac artery origins bilaterally. No pathologic adenopathy within the abdomen and pelvis. Reproductive: Prostate is unremarkable. Other: Small fat containing left inguinal hernia Musculoskeletal: No acute bone abnormality. No lytic or blastic bone lesion. Osseous structures are age appropriate. IMPRESSION: 1. No acute findings within the abdomen or pelvis. 2. Peripheral vascular disease with hemodynamically significant stenoses of the lower extremity arterial inflow and internal iliac arteries bilaterally. Correlation for lower extremity claudication or gluteal claudication may be helpful for further management. 3. Small fat containing left inguinal hernia. Aortic Atherosclerosis (ICD10-I70.0). Electronically Signed: By: Dorethia Molt M.D. On: 04/10/2024 02:29   Reviewed CT A/P:     ED Course:  Treated with 1 L IVF, Zofran, morphine     Assessment/Plan:  59 y.o. male with hx remote TIA, hx smoking (recently quit vaping), HLD, who presents with acute onset of RUQ abd pain radiating to his back with associated N/V. Admitted with biliary colic with suspected developing  cholecystitis   Biliary colic, stone at GB neck, 4 mm  Suspect developing cholecystitis  Acute RUQ pain to back beginning after fatty meal. Reports associated chills. VS wnl, afebrile. WBC 12. CT A/P demonstrating GB distension with 4 mm stone at GB neck without other findings of cholecystitis.  - Routine Gen surg consult in AM, not contacted overnight  - Check RUQ US   - Started on Ceftriaxone 2 g IV q 24 hr, Flagyl 500 mg IV q 12 hr  - CLD for now while awaiting additional studies.   Incidental findings:  Significant PAD with hemodynamically significant stenosis of Lower ext. arterial inflow and internal iliac a. Bilaterally:  Extensive coronary calcification:  - Check Lipids and A1c  - Would benefit from statin, aspirin . Can address after acute issues above  - Establish with vascular surgery outpatient.   Hyperglycemia  - Checking A1c per above  - SSI for very sensitive   Chronic medical problems:  Hx smoking (recently quit vaping): quit vaping 10 days ago, declines NRT inpatient. Continue to support cessation.  Hx HLD: will reasses per above. Not on medication  Remote TIA: noted.  Obesity class II: would benefit from weight loss outpatient   Body mass index is  35.26 kg/m.    DVT prophylaxis:  SCDs Code Status:  Full Code Diet:  Diet Orders (From admission, onward)     Start     Ordered   04/10/24 0310  Diet clear liquid Room service appropriate? Yes; Fluid consistency: Thin  Diet effective now       Question Answer Comment  Room service appropriate? Yes   Fluid consistency: Thin      04/10/24 0311           Family Communication:  None   Consults:  None   Admission status:   Observation, Med-Surg  Severity of Illness: The appropriate patient status for this patient is OBSERVATION. Observation status is judged to be reasonable and necessary in order to provide the required intensity of service to ensure the patient's safety. The patient's presenting symptoms,  physical exam findings, and initial radiographic and laboratory data in the context of their medical condition is felt to place them at decreased risk for further clinical deterioration. Furthermore, it is anticipated that the patient will be medically stable for discharge from the hospital within 2 midnights of admission.    Dorn Dawson, MD [facility]  How to contact the [facility] Attending or Consulting provider 7A - 7P or covering provider during after hours 7P -7A, for this patient.  Check the care team in [facility] and look for a) attending/consulting [facility] provider listed and b) the [facility] team listed Log into [email or web address] and use [place]'s universal password to access. If you do not have the password, please contact the hospital operator. Locate the [facility] provider you are looking for under [facility] and page to a number that you can be directly reached. If you still have difficulty reaching the provider, please page the [facility] (Director on Call) for the Hospitalists listed on amion for assistance.  04/10/2024, 3:56 AM

## 2024-04-10 NOTE — ED Notes (Signed)
 Patient transported to CT

## 2024-04-10 NOTE — Progress Notes (Signed)
 PROGRESS NOTE    KADDEN OSTERHOUT  FMW:[id number] DOB: 1966-05-30 DOA: 04/10/2024 PCP: Patient, No Pcp Per   Brief Narrative: 58 year old with past medical history significant for TIA, history of smoking recently quit vaping, hyperlipidemia presented with acute onset of right upper quadrant abdominal pain radiating to his back associated with nausea and vomiting.  Pain started after he ate steak.  CT abdomen and pelvis : peripheral vascular disease with hemodynamically significant stenosis of the lower extremity arterial inflow and internal iliac arteries bilaterally, single 4 mm gallstone seen in the gallbladder neck, the gallbladder is mildly distended, there is no associated gallbladder wall thickening or pericholecystic inflammation stranding or fluid identified to suggest superimposed changes of acute cholecystitis.     Assessment & Plan:   Principal Problem:   Biliary colic Active Problems:   PAD (peripheral artery disease) (HCC)   CAD (coronary artery disease)   Hyperglycemia   1-Epigastric right upper quadrant abdominal pain - Patient presented with abdominal pain after eating fatty food CT abdomen showed 4 mm stone, gallbladder distention. - Continue with IV ceftriaxone and Flagyl - Continue with clear liquid - Right upper quadrant ultrasound ordered - Surgery has been consulted - Start IV Protonix - Liver function test and lipase normal  2-incidental findings: Significant PAD with hemodynamically significant stenosis of Lower ext. arterial inflow and internal iliac a. Bilaterally:  Extensive coronary calcification:  - Patient denies claudication, no thickened discoloration lower extremities. -Discussed case with Dr. Sheree vascular surgeon at [facility] who recommend outpatient follow-up.  He will help arrange follow-up  3-Hyperglycemia: A1c pending   Hx smoking (recently quit vaping): quit vaping 10 days ago, declines NRT inpatient. Continue to support cessation.  Hx HLD:  will reasses per above. Not on medication  Remote TIA: noted.  Obesity class II: would benefit from weight loss outpatient    Body mass index is 35.26 kg/m.      Estimated body mass index is 35.52 kg/m as calculated from the following:   Height as of this encounter: 6' (1.829 m).   Weight as of this encounter: 118.8 kg.   DVT prophylaxis: SCD Code Status: Full code Family Communication: Disposition Plan:  Status is: Observation The patient remains OBS appropriate and will d/c before 2 midnights.    Consultants:  General Sx  Procedures:  none  Antimicrobials:    Subjective: He report abdominal pain, radiate to back, yesterday pain was 9/10, severe stabbing pain.  Today pain down to 4. Denies drinking alcohol.   Objective: Vitals:   04/10/24 0107 04/10/24 0109 04/10/24 0308 04/10/24 0404  BP:  (!) 151/73 (!) 155/70 (!) 161/87  Pulse:  69 71 76  Resp:   15 (!) 24  Temp:    98.2 F (36.8 C)  TempSrc:    Oral  SpO2:  94% 90% 96%  Weight: 117.9 kg   118.8 kg  Height: 6' (1.829 m)   6' (1.829 m)    Intake/Output Summary (Last 24 hours) at 04/10/2024 0750 Last data filed at 04/10/2024 0500 Gross per 24 hour  Intake 240 ml  Output --  Net 240 ml   Filed Weights   04/10/24 0107 04/10/24 0404  Weight: 117.9 kg 118.8 kg    Examination:  General exam: Appears calm and comfortable  Respiratory system: Clear to auscultation. Respiratory effort normal. Cardiovascular system: S1 & S2 heard, RRR. No JVD, murmurs, rubs, gallops or clicks. No pedal edema. Gastrointestinal system: Abdomen is obese, tender, distended.  Central nervous system:  Alert and oriented. No focal neurological deficits. Extremities: Symmetric 5 x 5 power.    Data Reviewed: I have personally reviewed following labs and imaging studies  CBC: Recent Labs  Lab 04/10/24 0128 04/10/24 0429  WBC 12.8* 11.1*  HGB 13.9 13.7  HCT 41.1 41.6  MCV 89.2 89.8  PLT 200 203   Basic Metabolic  Panel: Recent Labs  Lab 04/10/24 0128 04/10/24 0429  NA 136 134*  K 4.1 4.3  CL 101 102  CO2 26 24  GLUCOSE 201* 214*  BUN 16 16  CREATININE 0.94 0.89  CALCIUM  8.4* 8.4*  MG  --  2.0  PHOS  --  3.1   GFR: Estimated Creatinine Clearance: 118.9 mL/min (by C-G formula based on SCr of 0.89 mg/dL). Liver Function Tests: Recent Labs  Lab 04/10/24 0128 04/10/24 0429  AST 18 22  ALT 30 33  ALKPHOS 80 77  BILITOT 0.6 0.7  PROT 6.3* 6.5  ALBUMIN 3.6 3.6   Recent Labs  Lab 04/10/24 0128  LIPASE 32   No results for input(s): AMMONIA in the last 168 hours. Coagulation Profile: No results for input(s): INR, PROTIME in the last 168 hours. Cardiac Enzymes: No results for input(s): CKTOTAL, CKMB, CKMBINDEX, TROPONINI in the last 168 hours. BNP (last 3 results) No results for input(s): PROBNP in the last 8760 hours. HbA1C: No results for input(s): HGBA1C in the last 72 hours. CBG: Recent Labs  Lab 04/10/24 0657  GLUCAP 169*   Lipid Profile: No results for input(s): CHOL, HDL, LDLCALC, TRIG, CHOLHDL, LDLDIRECT in the last 72 hours. Thyroid Function Tests: No results for input(s): TSH, T4TOTAL, FREET4, T3FREE, THYROIDAB in the last 72 hours. Anemia Panel: No results for input(s): VITAMINB12, FOLATE, FERRITIN, TIBC, IRON, RETICCTPCT in the last 72 hours. Sepsis Labs: No results for input(s): PROCALCITON, LATICACIDVEN in the last 168 hours.  No results found for this or any previous visit (from the past 240 hours).       Radiology Studies: CT ABDOMEN PELVIS W CONTRAST Addendum Date: 04/10/2024 ADDENDUM REPORT: 04/10/2024 02:42 ADDENDUM: Upon further review, a single 4 mm gallstone is seen within the gallbladder neck. The gallbladder is mildly distended, however, there is no associated gallbladder wall thickening or pericholecystic inflammatory stranding or fluid identified to suggest superimposed changes of acute  cholecystitis. These results were discussed with managing clinician, Roselyn, MD at time of addendum on 04/10/2024 at 2:41 am. Electronically Signed   By: Dorethia Molt M.D.   On: 04/10/2024 02:42   Result Date: 04/10/2024 CLINICAL DATA:  Epigastric abdominal pain, bloating, nausea, vomiting EXAM: CT ABDOMEN AND PELVIS WITH CONTRAST TECHNIQUE: Multidetector CT imaging of the abdomen and pelvis was performed using the standard protocol following bolus administration of intravenous contrast. RADIATION DOSE REDUCTION: This exam was performed according to the departmental dose-optimization program which includes automated exposure control, adjustment of the mA and/or kV according to patient size and/or use of iterative reconstruction technique. CONTRAST:  100mL OMNIPAQUE IOHEXOL 300 MG/ML  SOLN COMPARISON:  None Available. FINDINGS: Lower chest: No acute abnormality. Extensive coronary artery calcification Hepatobiliary: No focal liver abnormality is seen. No gallstones, gallbladder wall thickening, or biliary dilatation. Pancreas: Unremarkable Spleen: Unremarkable Adrenals/Urinary Tract: Adrenal glands are unremarkable. Kidneys are normal, without renal calculi, focal lesion, or hydronephrosis. Bladder is unremarkable. Stomach/Bowel: Stomach is within normal limits. Appendix normal. No evidence of bowel wall thickening, distention, or inflammatory changes. Vascular/Lymphatic: Extensive mixed aortoiliac atherosclerotic plaque with chronic occlusion of the right common iliac artery. Greater  than 50% stenosis of the left common iliac artery origin. Calcified hemodynamically significant stenoses of the external iliac artery origins. High-grade calcified stenosis of the internal iliac artery origins bilaterally. No pathologic adenopathy within the abdomen and pelvis. Reproductive: Prostate is unremarkable. Other: Small fat containing left inguinal hernia Musculoskeletal: No acute bone abnormality. No lytic or blastic bone  lesion. Osseous structures are age appropriate. IMPRESSION: 1. No acute findings within the abdomen or pelvis. 2. Peripheral vascular disease with hemodynamically significant stenoses of the lower extremity arterial inflow and internal iliac arteries bilaterally. Correlation for lower extremity claudication or gluteal claudication may be helpful for further management. 3. Small fat containing left inguinal hernia. Aortic Atherosclerosis (ICD10-I70.0). Electronically Signed: By: Dorethia Molt M.D. On: 04/10/2024 02:29        Scheduled Meds:  insulin aspart  0-6 Units Subcutaneous TID WC   sodium chloride flush  3 mL Intravenous Q12H   Continuous Infusions:  cefTRIAXone (ROCEPHIN)  IV 2 g (04/10/24 0447)   metronidazole 500 mg (04/10/24 0600)     LOS: 0 days    Time spent: 35 minutes    Jorma Tassinari A Masato Pettie, MD [facility]   If 7PM-7AM, please contact night-coverage [email or web address]  04/10/2024, 7:50 AM

## 2024-04-10 NOTE — ED Triage Notes (Signed)
 Pt bib EMS after developing abdominal pain, bloating, nausea, and emesis after eating [place] earlier. EMS gave 4mg  Zofran and cc NS.

## 2024-04-10 NOTE — ED Provider Notes (Signed)
 [place] EMERGENCY DEPARTMENT AT [facility]  Provider Note  CSN: [id number] Arrival date & time: 04/10/24 0102  History Chief Complaint  Patient presents with   Abdominal Pain   Emesis    Brett Glover is a 58 y.o. male with no significant PMH reports onset of epigastric pain and vomiting about an hour after eating dinner, radiating into back and more diffusely across abdomen. No recent melena or hematochezia. No history of similar. No EtOH use. No prior abdominal surgeries.    Home Medications Prior to Admission medications   Medication Sig Start Date End Date Taking? Authorizing Provider  HYDROcodone -acetaminophen  (NORCO/VICODIN) 5-325 MG per tablet Take 1-2 tablets by mouth every 4 (four) hours as needed. 01/14/14   Muthersbaugh, Chiquita, PA-C  methocarbamol  (ROBAXIN ) 500 MG tablet Take 1 tablet (500 mg total) by mouth 2 (two) times daily as needed for muscle spasms. 01/14/14   Muthersbaugh, Chiquita, PA-C     Allergies    Patient has no known allergies.   Review of Systems   Review of Systems Please see HPI for pertinent positives and negatives  Physical Exam BP (!) 151/73 (BP Location: Right Arm)   Pulse 69   Temp 98.1 F (36.7 C) (Oral)   Resp 20   Ht 6' (1.829 m)   Wt 117.9 kg   SpO2 94%   BMI 35.26 kg/m   Physical Exam Vitals and nursing note reviewed.  Constitutional:      Appearance: Normal appearance.  HENT:     Head: Normocephalic and atraumatic.     Nose: Nose normal.     Mouth/Throat:     Mouth: Mucous membranes are moist.   Eyes:     Extraocular Movements: Extraocular movements intact.     Conjunctiva/sclera: Conjunctivae normal.    Cardiovascular:     Rate and Rhythm: Normal rate.  Pulmonary:     Effort: Pulmonary effort is normal.     Breath sounds: Normal breath sounds.  Abdominal:     General: Abdomen is flat.     Palpations: Abdomen is soft.     Tenderness: There is abdominal tenderness in the epigastric area. There is  no guarding. Negative signs include Murphy's sign and McBurney's sign.   Musculoskeletal:        General: No swelling. Normal range of motion.     Cervical back: Neck supple.   Skin:    General: Skin is warm and dry.   Neurological:     General: No focal deficit present.     Mental Status: He is alert.   Psychiatric:        Mood and Affect: Mood normal.     ED Results / Procedures / Treatments   EKG None  Procedures Procedures  Medications Ordered in the ED Medications  morphine (PF) 4 MG/ML injection 4 mg (has no administration in time range)  morphine (PF) 4 MG/ML injection 4 mg (4 mg Intravenous Given 04/10/24 0134)  ondansetron (ZOFRAN) injection 4 mg (4 mg Intravenous Given 04/10/24 0133)  lactated ringers bolus 1,000 mL (1,000 mLs Intravenous New Bag/Given 04/10/24 0132)  iohexol (OMNIPAQUE) 300 MG/ML solution 100 mL (100 mLs Intravenous Contrast Given 04/10/24 0202)    Initial Impression and Plan  Patient here with abdominal pain and vomiting after dinner. Consider gastritis, pancreatitis, biliary disease. Will check labs and send for CT. Pain/nausea meds and IVF for comfort.   ED Course   Clinical Course as of 04/10/24 0306  Sun Apr 10, 2024  0143 CBC with mildly elevated WBC.  [CS]  0204 CMP with mild hyperglycemia, no acidosis. Normal LFTs and lipase.  [CS]  0239 I personally viewed the images from radiology studies and agree with radiologist interpretation: CT images reviewed with Radiologist. There is a small stone in the gall bladder neck but no other inflammatory changes. Other incidental findings of vascular disease noted and communicated to patient.  [CS]  0255 Patient still reporting pain, initially improved with analgesia but has since returned. Will give additional pain medication, plan admission for pain control and US  when they are available later this morning.  [CS]  0305 Spoke with Dr. Keturah, Hospitalist, who will evaluate for admission.  [CS]     Clinical Course User Index [CS] Roselyn Carlin NOVAK, MD     MDM Rules/Calculators/A&P Medical Decision Making Problems Addressed: Biliary colic: acute illness or injury  Amount and/or Complexity of Data Reviewed Labs: ordered. Decision-making details documented in ED Course. Radiology: ordered and independent interpretation performed.  Risk Prescription drug management. Parenteral controlled substances. Decision regarding hospitalization.     Final Clinical Impression(s) / ED Diagnoses Final diagnoses:  Biliary colic    Rx / DC Orders ED Discharge Orders     None        Roselyn Carlin NOVAK, MD 04/10/24 [phone]

## 2024-04-10 NOTE — Plan of Care (Signed)

## 2024-04-10 NOTE — Consult Note (Signed)
 Reason for Consult: Right upper quadrant abdominal pain Referring Physician: Dr. Madelyne Charlie Brett Glover is an 58 y.o. male.  HPI: Patient is a 58 year old white male with a history of a remote TIA and paroxysmal tachycardia who yesterday evening started having right upper quadrant abdominal pain, nausea, and vomiting after eating a T-bone steak.  He states this was his first episode.  He denies any fatty food intolerance.  He denies any fever, chills, or jaundice.  He presented to the emergency room and was noted on CT scan of the abdomen to have cholelithiasis.  There was also noted to have vascular disease.  He was admitted to the hospital for further evaluation and treatment.  He states that his pain is eased.  He is hungry. He states he has not had an episode of tachycardia over the past 3 months.  He did have initial workup by cardiology but did not complete it.  He denies any chest pain.  Past Medical History:  Diagnosis Date   Broken jaw (HCC) 1983   Cervical vertebral closed fracture (HCC)    Chicken pox as a child   Elevated cholesterol    History of cervical fracture    Hyperlipidemia    Kidney stones 1990   Tobacco abuse 05/19/2012    Past Surgical History:  Procedure Laterality Date   CERVICAL FUSION     fatty tissue removed  2007   left abdominal wall, lipoma   fusion in neck  2004   kidney stones removed  1990    Family History  Problem Relation Age of Onset   Seizures Mother    ADD / ADHD Daughter    Mental illness Daughter        bipolar, add   Heart disease Sister     Social History:  reports that he has been smoking cigarettes. He has a 30 pack-year smoking history. He does not have any smokeless tobacco history on file. He reports current alcohol use. He reports that he does not use drugs.  Allergies: No Known Allergies  Medications: I have reviewed the patient's current medications. Prior to Admission:  No medications prior to admission.    Results  for orders placed or performed during the hospital encounter of 04/10/24 (from the past 48 hours)  Lipase, blood     Status: None   Collection Time: 04/10/24  1:28 AM  Result Value Ref Range   Lipase 32 11 - 51 U/L    Comment: Performed at [facility], [address], [place], [place] [address]  Comprehensive metabolic panel     Status: Abnormal   Collection Time: 04/10/24  1:28 AM  Result Value Ref Range   Sodium 136 135 - 145 mmol/L   Potassium 4.1 3.5 - 5.1 mmol/L   Chloride 101 98 - 111 mmol/L   CO2 26 22 - 32 mmol/L   Glucose, Bld 201 (H) 70 - 99 mg/dL    Comment: Glucose reference range applies only to samples taken after fasting for at least 8 hours.   BUN 16 6 - 20 mg/dL   Creatinine, Ser 9.05 0.61 - 1.24 mg/dL   Calcium  8.4 (L) 8.9 - 10.3 mg/dL   Total Protein 6.3 (L) 6.5 - 8.1 g/dL   Albumin 3.6 3.5 - 5.0 g/dL   AST 18 15 - 41 U/L   ALT 30 0 - 44 U/L   Alkaline Phosphatase 80 38 - 126 U/L   Total Bilirubin 0.6 0.0 - 1.2 mg/dL  GFR, Estimated >60 >60 mL/min    Comment: (NOTE) Calculated using the CKD-EPI Creatinine Equation (2021)    Anion gap 9 5 - 15    Comment: Performed at [facility], [address], [place], [place] [address]  CBC     Status: Abnormal   Collection Time: 04/10/24  1:28 AM  Result Value Ref Range   WBC 12.8 (H) 4.0 - 10.5 K/uL   RBC 4.61 4.22 - 5.81 MIL/uL   Hemoglobin 13.9 13.0 - 17.0 g/dL   HCT 58.8 60.9 - 47.9 %   MCV 89.2 80.0 - 100.0 fL   MCH 30.2 26.0 - 34.0 pg   MCHC 33.8 30.0 - 36.0 g/dL   RDW 86.7 88.4 - 84.4 %   Platelets 200 150 - 400 K/uL   nRBC 0.0 0.0 - 0.2 %    Comment: Performed at [facility], [address] [place], [place] [address]  Urinalysis, Routine w reflex microscopic -Urine, Clean Catch     Status: Abnormal   Collection Time: 04/10/24  3:10 AM  Result Value Ref Range   Color, Urine YELLOW YELLOW   APPearance CLEAR CLEAR   Specific Gravity, Urine >1.046 (H) 1.005 - 1.030   pH 6.0 5.0 - 8.0   Glucose, UA 150  (A) NEGATIVE mg/dL   Hgb urine dipstick NEGATIVE NEGATIVE   Bilirubin Urine NEGATIVE NEGATIVE   Ketones, ur NEGATIVE NEGATIVE mg/dL   Protein, ur NEGATIVE NEGATIVE mg/dL   Nitrite NEGATIVE NEGATIVE   Leukocytes,Ua NEGATIVE NEGATIVE    Comment: Performed at [facility], [address], [place], [place] [address]  Basic metabolic panel with GFR     Status: Abnormal   Collection Time: 04/10/24  4:29 AM  Result Value Ref Range   Sodium 134 (L) 135 - 145 mmol/L   Potassium 4.3 3.5 - 5.1 mmol/L   Chloride 102 98 - 111 mmol/L   CO2 24 22 - 32 mmol/L   Glucose, Bld 214 (H) 70 - 99 mg/dL    Comment: Glucose reference range applies only to samples taken after fasting for at least 8 hours.   BUN 16 6 - 20 mg/dL   Creatinine, Ser 9.10 0.61 - 1.24 mg/dL   Calcium  8.4 (L) 8.9 - 10.3 mg/dL   GFR, Estimated >39 >39 mL/min    Comment: (NOTE) Calculated using the CKD-EPI Creatinine Equation (2021)    Anion gap 8 5 - 15    Comment: Performed at [facility], [address], [place], [place] [address]  CBC     Status: Abnormal   Collection Time: 04/10/24  4:29 AM  Result Value Ref Range   WBC 11.1 (H) 4.0 - 10.5 K/uL   RBC 4.63 4.22 - 5.81 MIL/uL   Hemoglobin 13.7 13.0 - 17.0 g/dL   HCT 58.3 60.9 - 47.9 %   MCV 89.8 80.0 - 100.0 fL   MCH 29.6 26.0 - 34.0 pg   MCHC 32.9 30.0 - 36.0 g/dL   RDW 86.8 88.4 - 84.4 %   Platelets 203 150 - 400 K/uL   nRBC 0.0 0.0 - 0.2 %    Comment: Performed at [facility], [address], [place], [place] [address]  Magnesium     Status: None   Collection Time: 04/10/24  4:29 AM  Result Value Ref Range   Magnesium 2.0 1.7 - 2.4 mg/dL    Comment: Performed at [facility], [address], [place], [place] [address]  Phosphorus     Status: None  Collection Time: 04/10/24  4:29 AM  Result Value Ref Range   Phosphorus 3.1 2.5 - 4.6 mg/dL    Comment: Performed at [facility], [address] [place], [place] [address]  Hepatic function panel     Status: None    Collection Time: 04/10/24  4:29 AM  Result Value Ref Range   Total Protein 6.5 6.5 - 8.1 g/dL   Albumin 3.6 3.5 - 5.0 g/dL   AST 22 15 - 41 U/L   ALT 33 0 - 44 U/L   Alkaline Phosphatase 77 38 - 126 U/L   Total Bilirubin 0.7 0.0 - 1.2 mg/dL   Bilirubin, Direct 0.1 0.0 - 0.2 mg/dL   Indirect Bilirubin 0.6 0.3 - 0.9 mg/dL    Comment: Performed at [facility], [address] [place], [place] [address]  Glucose, capillary     Status: Abnormal   Collection Time: 04/10/24  6:57 AM  Result Value Ref Range   Glucose-Capillary 169 (H) 70 - 99 mg/dL    Comment: Glucose reference range applies only to samples taken after fasting for at least 8 hours.    CT ABDOMEN PELVIS W CONTRAST Addendum Date: 04/10/2024 ADDENDUM REPORT: 04/10/2024 02:42 ADDENDUM: Upon further review, a single 4 mm gallstone is seen within the gallbladder neck. The gallbladder is mildly distended, however, there is no associated gallbladder wall thickening or pericholecystic inflammatory stranding or fluid identified to suggest superimposed changes of acute cholecystitis. These results were discussed with managing clinician, Roselyn, MD at time of addendum on 04/10/2024 at 2:41 am. Electronically Signed   By: Dorethia Molt M.D.   On: 04/10/2024 02:42   Result Date: 04/10/2024 CLINICAL DATA:  Epigastric abdominal pain, bloating, nausea, vomiting EXAM: CT ABDOMEN AND PELVIS WITH CONTRAST TECHNIQUE: Multidetector CT imaging of the abdomen and pelvis was performed using the standard protocol following bolus administration of intravenous contrast. RADIATION DOSE REDUCTION: This exam was performed according to the departmental dose-optimization program which includes automated exposure control, adjustment of the mA and/or kV according to patient size and/or use of iterative reconstruction technique. CONTRAST:  100mL OMNIPAQUE IOHEXOL 300 MG/ML  SOLN COMPARISON:  None Available. FINDINGS: Lower chest: No acute abnormality. Extensive coronary  artery calcification Hepatobiliary: No focal liver abnormality is seen. No gallstones, gallbladder wall thickening, or biliary dilatation. Pancreas: Unremarkable Spleen: Unremarkable Adrenals/Urinary Tract: Adrenal glands are unremarkable. Kidneys are normal, without renal calculi, focal lesion, or hydronephrosis. Bladder is unremarkable. Stomach/Bowel: Stomach is within normal limits. Appendix normal. No evidence of bowel wall thickening, distention, or inflammatory changes. Vascular/Lymphatic: Extensive mixed aortoiliac atherosclerotic plaque with chronic occlusion of the right common iliac artery. Greater than 50% stenosis of the left common iliac artery origin. Calcified hemodynamically significant stenoses of the external iliac artery origins. High-grade calcified stenosis of the internal iliac artery origins bilaterally. No pathologic adenopathy within the abdomen and pelvis. Reproductive: Prostate is unremarkable. Other: Small fat containing left inguinal hernia Musculoskeletal: No acute bone abnormality. No lytic or blastic bone lesion. Osseous structures are age appropriate. IMPRESSION: 1. No acute findings within the abdomen or pelvis. 2. Peripheral vascular disease with hemodynamically significant stenoses of the lower extremity arterial inflow and internal iliac arteries bilaterally. Correlation for lower extremity claudication or gluteal claudication may be helpful for further management. 3. Small fat containing left inguinal hernia. Aortic Atherosclerosis (ICD10-I70.0). Electronically Signed: By: Dorethia Molt M.D. On: 04/10/2024 02:29    ROS:  Pertinent items are noted in HPI.  Blood pressure 118/72, pulse 80, temperature 98  F (36.7 C), temperature source Oral, resp. rate 18, height 6' (1.829 m), weight 118.8 kg, SpO2 95%. Physical Exam: Obese white male in no acute distress Head is normocephalic, atraumatic Eyes are without scleral icterus Lungs are clear to auscultation with equal  breath sounds bilaterally Heart examination reveals a regular rate and rhythm without S3, S4, murmurs Abdomen is soft with some tenderness to deep palpation of the right upper quadrant.  No rigidity is noted.  Ultrasound of right upper quadrant pending  Assessment/Plan: Impression: Biliary colic secondary to cholelithiasis.  Ultrasound is pending.  LFTs are within normal limits.  Leukocytosis is mild and resolving. Plan: Pending ultrasound report, patient cholecystectomy.  Prior to surgery, a 2D echo of the heart is needed given his cardiac history.  Patient may be placed on a diet today.  Will decide whether he needs surgery as an inpatient or electively as an outpatient.  Will order 2D echo for tomorrow.  Brett Glover 04/10/2024, 9:13 AM

## 2024-04-10 NOTE — Progress Notes (Signed)
   04/10/24 1739  TOC Brief Assessment  Insurance and Status Reviewed  Patient has primary care physician No (PCP list added to AVS)  Home environment has been reviewed From home  Prior level of function: Independent  Prior/Current Home Services No current home services  Social Drivers of Health Review SDOH reviewed interventions complete (smoking cessation added to AVS)  Readmission risk has been reviewed Yes  Transition of care needs no transition of care needs at this time   Transition of Care Department [facility]) has reviewed patient and no TOC needs have been identified at this time. We will continue to monitor patient advancement through interdisciplinary progression rounds. If new patient transition needs arise, please place a TOC consult.

## 2024-04-10 NOTE — Discharge Instructions (Signed)

## 2024-04-11 ENCOUNTER — Observation Stay (HOSPITAL_COMMUNITY)

## 2024-04-11 DIAGNOSIS — Z01810 Encounter for preprocedural cardiovascular examination: Secondary | ICD-10-CM | POA: Diagnosis not present

## 2024-04-11 DIAGNOSIS — K8050 Calculus of bile duct without cholangitis or cholecystitis without obstruction: Secondary | ICD-10-CM | POA: Diagnosis not present

## 2024-04-11 LAB — ECHOCARDIOGRAM COMPLETE
AR max vel: 2.24 cm2
AV Area VTI: 2.5 cm2
AV Area mean vel: 1.83 cm2
AV Mean grad: 2 mmHg
AV Peak grad: 4.2 mmHg
Ao pk vel: 1.03 m/s
Area-P 1/2: 2.38 cm2
Calc EF: 69.3 %
Height: 72 in
S' Lateral: 2.8 cm
Single Plane A2C EF: 62.5 %
Single Plane A4C EF: 74.1 %
Weight: 4190.4 [oz_av]

## 2024-04-11 LAB — CBC
HCT: 43.7 % (ref 39.0–52.0)
Hemoglobin: 14.3 g/dL (ref 13.0–17.0)
MCH: 28.9 pg (ref 26.0–34.0)
MCHC: 32.7 g/dL (ref 30.0–36.0)
MCV: 88.5 fL (ref 80.0–100.0)
Platelets: 198 10*3/uL (ref 150–400)
RBC: 4.94 MIL/uL (ref 4.22–5.81)
RDW: 13.4 % (ref 11.5–15.5)
WBC: 7.3 10*3/uL (ref 4.0–10.5)
nRBC: 0 % (ref 0.0–0.2)

## 2024-04-11 LAB — LIPID PANEL
Cholesterol: 176 mg/dL (ref 0–200)
HDL: 35 mg/dL — ABNORMAL LOW (ref >40)
LDL Cholesterol: 124 mg/dL — ABNORMAL HIGH (ref 0–99)
Total CHOL/HDL Ratio: 5 ratio
Triglycerides: 85 mg/dL (ref <150)
VLDL: 17 mg/dL (ref 0–40)

## 2024-04-11 LAB — COMPREHENSIVE METABOLIC PANEL WITH GFR
ALT: 28 U/L (ref 0–44)
AST: 18 U/L (ref 15–41)
Albumin: 3.4 g/dL — ABNORMAL LOW (ref 3.5–5.0)
Alkaline Phosphatase: 85 U/L (ref 38–126)
Anion gap: 9 (ref 5–15)
BUN: 10 mg/dL (ref 6–20)
CO2: 25 mmol/L (ref 22–32)
Calcium: 8.5 mg/dL — ABNORMAL LOW (ref 8.9–10.3)
Chloride: 103 mmol/L (ref 98–111)
Creatinine, Ser: 0.85 mg/dL (ref 0.61–1.24)
GFR, Estimated: >60 mL/min (ref >60)
Glucose, Bld: 124 mg/dL — ABNORMAL HIGH (ref 70–99)
Potassium: 3.8 mmol/L (ref 3.5–5.1)
Sodium: 137 mmol/L (ref 135–145)
Total Bilirubin: 0.8 mg/dL (ref 0.0–1.2)
Total Protein: 6.4 g/dL — ABNORMAL LOW (ref 6.5–8.1)

## 2024-04-11 LAB — GLUCOSE, CAPILLARY
Glucose-Capillary: 132 mg/dL — ABNORMAL HIGH (ref 70–99)
Glucose-Capillary: 194 mg/dL — ABNORMAL HIGH (ref 70–99)
Glucose-Capillary: 133 mg/dL — ABNORMAL HIGH (ref 70–99)
Glucose-Capillary: 186 mg/dL — ABNORMAL HIGH (ref 70–99)

## 2024-04-11 LAB — HIV ANTIBODY (ROUTINE TESTING W REFLEX): HIV Screen 4th Generation wRfx: NONREACTIVE

## 2024-04-11 MED ORDER — PERFLUTREN LIPID MICROSPHERE
1.0000 mL | INTRAVENOUS | Status: AC | PRN
  Administered 2024-04-11: 4 mL via INTRAVENOUS

## 2024-04-11 MED ORDER — INDOCYANINE GREEN 25 MG IV SOLR
2.5000 mg | Freq: Once | INTRAVENOUS | Status: AC
  Administered 2024-04-12: 2.5 mg via INTRAVENOUS
  Filled 2024-04-11: qty 10

## 2024-04-11 MED ORDER — CHLORHEXIDINE GLUCONATE CLOTH 2 % EX PADS
6.0000 | MEDICATED_PAD | Freq: Once | CUTANEOUS | Status: AC
  Administered 2024-04-12: 6 via TOPICAL

## 2024-04-11 MED ORDER — CHLORHEXIDINE GLUCONATE CLOTH 2 % EX PADS
6.0000 | MEDICATED_PAD | Freq: Once | CUTANEOUS | Status: AC
Start: 2024-04-11 — End: 2024-04-11
  Administered 2024-04-11: 6 via TOPICAL

## 2024-04-11 NOTE — Progress Notes (Signed)
 Subjective: Patient woke up with some right upper quadrant abdominal pain this morning.  Denies any nausea or vomiting.  Objective: Vital signs in last 24 hours: Temp:  [98.5 F (36.9 C)-98.9 F (37.2 C)] 98.9 F (37.2 C) (06/23 0428) Pulse Rate:  [82-84] 83 (06/23 0428) Resp:  [18-20] 19 (06/23 0428) BP: (103-149)/(61-85) 120/74 (06/23 0428) SpO2:  [92 %-97 %] 92 % (06/23 0428) Last BM Date : 04/09/24  Intake/Output from previous day: 06/22 0701 - 06/23 0700 In: 2304.6 [P.O.:600; I.V.:1367.4; IV Piggyback:337.2] Out: -  Intake/Output this shift: No intake/output data recorded.  General appearance: alert, cooperative, and no distress GI: Soft, some discomfort to deep palpation in right upper quadrant.  No rigidity is noted.  Lab Results:  Recent Labs    04/10/24 0429 04/11/24 0355  WBC 11.1* 7.3  HGB 13.7 14.3  HCT 41.6 43.7  PLT 203 198   BMET Recent Labs    04/10/24 0429 04/11/24 0355  NA 134* 137  K 4.3 3.8  CL 102 103  CO2 24 25  GLUCOSE 214* 124*  BUN 16 10  CREATININE 0.89 0.85  CALCIUM  8.4* 8.5*   PT/INR No results for input(s): LABPROT, INR in the last 72 hours.  Studies/Results: US  Abdomen Limited RUQ (LIVER/GB) Result Date: 04/10/2024 CLINICAL DATA:  28051 Biliary colic [phone] EXAM: ULTRASOUND ABDOMEN LIMITED RIGHT UPPER QUADRANT COMPARISON:  None Available. FINDINGS: Gallbladder: No gallstones, pericholecystic fluid or wall thickening visualized. Common bile duct: Diameter: 0.5 cm. Liver: Liver is hyperechoic consistent with fatty infiltration. No focal hepatic lesions identified. No intrahepatic ductal dilatation. Hepatopetal portal vein flow. IMPRESSION: Hepatic fatty infiltration. Otherwise unremarkable examination of the right upper quadrant. Electronically Signed   By: Fonda Field M.D.   On: 04/10/2024 11:51   CT ABDOMEN PELVIS W CONTRAST Addendum Date: 04/10/2024 ADDENDUM REPORT: 04/10/2024 02:42 ADDENDUM: Upon further review, a  single 4 mm gallstone is seen within the gallbladder neck. The gallbladder is mildly distended, however, there is no associated gallbladder wall thickening or pericholecystic inflammatory stranding or fluid identified to suggest superimposed changes of acute cholecystitis. These results were discussed with managing clinician, Roselyn, MD at time of addendum on 04/10/2024 at 2:41 am. Electronically Signed   By: Dorethia Molt M.D.   On: 04/10/2024 02:42   Result Date: 04/10/2024 CLINICAL DATA:  Epigastric abdominal pain, bloating, nausea, vomiting EXAM: CT ABDOMEN AND PELVIS WITH CONTRAST TECHNIQUE: Multidetector CT imaging of the abdomen and pelvis was performed using the standard protocol following bolus administration of intravenous contrast. RADIATION DOSE REDUCTION: This exam was performed according to the departmental dose-optimization program which includes automated exposure control, adjustment of the mA and/or kV according to patient size and/or use of iterative reconstruction technique. CONTRAST:  100mL OMNIPAQUE IOHEXOL 300 MG/ML  SOLN COMPARISON:  None Available. FINDINGS: Lower chest: No acute abnormality. Extensive coronary artery calcification Hepatobiliary: No focal liver abnormality is seen. No gallstones, gallbladder wall thickening, or biliary dilatation. Pancreas: Unremarkable Spleen: Unremarkable Adrenals/Urinary Tract: Adrenal glands are unremarkable. Kidneys are normal, without renal calculi, focal lesion, or hydronephrosis. Bladder is unremarkable. Stomach/Bowel: Stomach is within normal limits. Appendix normal. No evidence of bowel wall thickening, distention, or inflammatory changes. Vascular/Lymphatic: Extensive mixed aortoiliac atherosclerotic plaque with chronic occlusion of the right common iliac artery. Greater than 50% stenosis of the left common iliac artery origin. Calcified hemodynamically significant stenoses of the external iliac artery origins. High-grade calcified stenosis of  the internal iliac artery origins bilaterally. No pathologic adenopathy within the abdomen and  pelvis. Reproductive: Prostate is unremarkable. Other: Small fat containing left inguinal hernia Musculoskeletal: No acute bone abnormality. No lytic or blastic bone lesion. Osseous structures are age appropriate. IMPRESSION: 1. No acute findings within the abdomen or pelvis. 2. Peripheral vascular disease with hemodynamically significant stenoses of the lower extremity arterial inflow and internal iliac arteries bilaterally. Correlation for lower extremity claudication or gluteal claudication may be helpful for further management. 3. Small fat containing left inguinal hernia. Aortic Atherosclerosis (ICD10-I70.0). Electronically Signed: By: Dorethia Molt M.D. On: 04/10/2024 02:29    Anti-infectives: Anti-infectives (From admission, onward)    Start     Dose/Rate Route Frequency Ordered Stop   04/10/24 0400  cefTRIAXone (ROCEPHIN) 2 g in sodium chloride 0.9 % 100 mL IVPB        2 g 200 mL/hr over 30 Minutes Intravenous Every 24 hours 04/10/24 0358     04/10/24 0400  metroNIDAZOLE (FLAGYL) IVPB 500 mg        500 mg 100 mL/hr over 60 Minutes Intravenous Every 12 hours 04/10/24 0358         Assessment/Plan: Impression: Biliary colic secondary to cholelithiasis.  A gallstone was seen on CT scan but this was not confirmed by ultrasound.  Given the patient has symptoms of biliary colic and cholelithiasis was seen on CT scan, will proceed with a robotic assisted laparoscopic cholecystectomy tomorrow pending the 2D echo report.  The risks and benefits of the procedure including bleeding, infection, hepatobiliary injury, and the possibility of an open procedure were fully explained to the patient, who gave informed consent.  LOS: 0 days    Oneil Budge 04/11/2024

## 2024-04-11 NOTE — H&P (View-Only) (Signed)
 Subjective: Patient woke up with some right upper quadrant abdominal pain this morning.  Denies any nausea or vomiting.  Objective: Vital signs in last 24 hours: Temp:  [98.5 F (36.9 C)-98.9 F (37.2 C)] 98.9 F (37.2 C) (06/23 0428) Pulse Rate:  [82-84] 83 (06/23 0428) Resp:  [18-20] 19 (06/23 0428) BP: (103-149)/(61-85) 120/74 (06/23 0428) SpO2:  [92 %-97 %] 92 % (06/23 0428) Last BM Date : 04/09/24  Intake/Output from previous day: 06/22 0701 - 06/23 0700 In: 2304.6 [P.O.:600; I.V.:1367.4; IV Piggyback:337.2] Out: -  Intake/Output this shift: No intake/output data recorded.  General appearance: alert, cooperative, and no distress GI: Soft, some discomfort to deep palpation in right upper quadrant.  No rigidity is noted.  Lab Results:  Recent Labs    04/10/24 0429 04/11/24 0355  WBC 11.1* 7.3  HGB 13.7 14.3  HCT 41.6 43.7  PLT 203 198   BMET Recent Labs    04/10/24 0429 04/11/24 0355  NA 134* 137  K 4.3 3.8  CL 102 103  CO2 24 25  GLUCOSE 214* 124*  BUN 16 10  CREATININE 0.89 0.85  CALCIUM  8.4* 8.5*   PT/INR No results for input(s): LABPROT, INR in the last 72 hours.  Studies/Results: US  Abdomen Limited RUQ (LIVER/GB) Result Date: 04/10/2024 CLINICAL DATA:  28051 Biliary colic [phone] EXAM: ULTRASOUND ABDOMEN LIMITED RIGHT UPPER QUADRANT COMPARISON:  None Available. FINDINGS: Gallbladder: No gallstones, pericholecystic fluid or wall thickening visualized. Common bile duct: Diameter: 0.5 cm. Liver: Liver is hyperechoic consistent with fatty infiltration. No focal hepatic lesions identified. No intrahepatic ductal dilatation. Hepatopetal portal vein flow. IMPRESSION: Hepatic fatty infiltration. Otherwise unremarkable examination of the right upper quadrant. Electronically Signed   By: Fonda Field M.D.   On: 04/10/2024 11:51   CT ABDOMEN PELVIS W CONTRAST Addendum Date: 04/10/2024 ADDENDUM REPORT: 04/10/2024 02:42 ADDENDUM: Upon further review, a  single 4 mm gallstone is seen within the gallbladder neck. The gallbladder is mildly distended, however, there is no associated gallbladder wall thickening or pericholecystic inflammatory stranding or fluid identified to suggest superimposed changes of acute cholecystitis. These results were discussed with managing clinician, Roselyn, MD at time of addendum on 04/10/2024 at 2:41 am. Electronically Signed   By: Dorethia Molt M.D.   On: 04/10/2024 02:42   Result Date: 04/10/2024 CLINICAL DATA:  Epigastric abdominal pain, bloating, nausea, vomiting EXAM: CT ABDOMEN AND PELVIS WITH CONTRAST TECHNIQUE: Multidetector CT imaging of the abdomen and pelvis was performed using the standard protocol following bolus administration of intravenous contrast. RADIATION DOSE REDUCTION: This exam was performed according to the departmental dose-optimization program which includes automated exposure control, adjustment of the mA and/or kV according to patient size and/or use of iterative reconstruction technique. CONTRAST:  100mL OMNIPAQUE IOHEXOL 300 MG/ML  SOLN COMPARISON:  None Available. FINDINGS: Lower chest: No acute abnormality. Extensive coronary artery calcification Hepatobiliary: No focal liver abnormality is seen. No gallstones, gallbladder wall thickening, or biliary dilatation. Pancreas: Unremarkable Spleen: Unremarkable Adrenals/Urinary Tract: Adrenal glands are unremarkable. Kidneys are normal, without renal calculi, focal lesion, or hydronephrosis. Bladder is unremarkable. Stomach/Bowel: Stomach is within normal limits. Appendix normal. No evidence of bowel wall thickening, distention, or inflammatory changes. Vascular/Lymphatic: Extensive mixed aortoiliac atherosclerotic plaque with chronic occlusion of the right common iliac artery. Greater than 50% stenosis of the left common iliac artery origin. Calcified hemodynamically significant stenoses of the external iliac artery origins. High-grade calcified stenosis of  the internal iliac artery origins bilaterally. No pathologic adenopathy within the abdomen and  pelvis. Reproductive: Prostate is unremarkable. Other: Small fat containing left inguinal hernia Musculoskeletal: No acute bone abnormality. No lytic or blastic bone lesion. Osseous structures are age appropriate. IMPRESSION: 1. No acute findings within the abdomen or pelvis. 2. Peripheral vascular disease with hemodynamically significant stenoses of the lower extremity arterial inflow and internal iliac arteries bilaterally. Correlation for lower extremity claudication or gluteal claudication may be helpful for further management. 3. Small fat containing left inguinal hernia. Aortic Atherosclerosis (ICD10-I70.0). Electronically Signed: By: Dorethia Molt M.D. On: 04/10/2024 02:29    Anti-infectives: Anti-infectives (From admission, onward)    Start     Dose/Rate Route Frequency Ordered Stop   04/10/24 0400  cefTRIAXone (ROCEPHIN) 2 g in sodium chloride 0.9 % 100 mL IVPB        2 g 200 mL/hr over 30 Minutes Intravenous Every 24 hours 04/10/24 0358     04/10/24 0400  metroNIDAZOLE (FLAGYL) IVPB 500 mg        500 mg 100 mL/hr over 60 Minutes Intravenous Every 12 hours 04/10/24 0358         Assessment/Plan: Impression: Biliary colic secondary to cholelithiasis.  A gallstone was seen on CT scan but this was not confirmed by ultrasound.  Given the patient has symptoms of biliary colic and cholelithiasis was seen on CT scan, will proceed with a robotic assisted laparoscopic cholecystectomy tomorrow pending the 2D echo report.  The risks and benefits of the procedure including bleeding, infection, hepatobiliary injury, and the possibility of an open procedure were fully explained to the patient, who gave informed consent.  LOS: 0 days    Oneil Budge 04/11/2024

## 2024-04-11 NOTE — Progress Notes (Signed)
 [facility] PROGRESS NOTE  Brett Glover (DOB: [birth year]) FMW:[id number] PCP: Patient, No Pcp Per  Brief Narrative: Brett Glover is a 58 y.o. male with a history of TIA, HLD, tobacco use who presented to the ED on 04/10/2024 with RUQ abdominal pain radiating to the back worse after eating steak. CT abdomen and pelvis : peripheral vascular disease with hemodynamically significant stenosis of the lower extremity arterial inflow and internal iliac arteries bilaterally, single 4 mm gallstone seen in the gallbladder neck, the gallbladder is mildly distended. Subsequent U/S did not identify gallstones, had normal CBD. Surgery was consulted and he was admitted for biliary colic. Preoperative echocardiogram has been performed, unremarkable, and plan is for laparoscopic cholecystectomy 6/24.  Subjective: No complaints, abdominal discomfort stable, improved with less po intake. Does not report claudication. No chest pain or focal neurological symptoms.   Objective: BP 120/87 (BP Location: Left Arm)   Pulse 94   Temp 99.4 F (37.4 C) (Oral)   Resp 20   Ht 6' (1.829 m)   Wt 118.8 kg   SpO2 94%   BMI 35.52 kg/m   Gen: No distress Pulm: Clear, nonlabored  CV: RRR, no MRG or edema GI: Soft, NT, ND, some RUQ tenderness with negative Murphy's Neuro: Alert and oriented. No new focal deficits. Ext: Warm, no deformities. Skin: No rashes, lesions or ulcers on visualized skin   Assessment & Plan: Epigastric right upper quadrant abdominal pain - Suspect developing acute cholecystitis. Surgery consulted, planning cholecystectomy 6/24. Continue diet today, will make NPO p MN.   Significant PAD with hemodynamically significant stenoses of Lower ext. arterial inflow and internal iliac arteries bilaterally, +coronary calcifications. All asymptomatic.   Extensive coronary calcification:  - Discussed case with Dr. Sheree vascular surgeon at [facility] who recommend outpatient follow-up.  He will help arrange  follow-up. - Needs ASA, statin. Depending on echo findings, consider beta blocker.    T2DM: HbA1c 6.8%, diagnostic for diabetes.  - Will need close outpatient follow up. Will discuss initiation of metformin vs. lifestyle modifications alone as initial Tx.    Hx smoking (recently quit vaping): quit vaping 10 days ago, declines NRT inpatient. Continue to support cessation.   HLD: LDL is 123, above goal s/p TIA.  - Suggest statin if patient agreeable.   Remote TIA: noted.  Obesity class II: would benefit from weight loss outpatient   Bernardino KATHEE Come, MD [facility] [email or web address] 04/11/2024, 5:11 PM

## 2024-04-11 NOTE — Progress Notes (Signed)
*  PRELIMINARY RESULTS* Echocardiogram 2D Echocardiogram has been performed.  Brett Glover 04/11/2024, 9:17 AM

## 2024-04-12 ENCOUNTER — Observation Stay (HOSPITAL_COMMUNITY): Admitting: Anesthesiology

## 2024-04-12 ENCOUNTER — Encounter (HOSPITAL_COMMUNITY): Payer: Self-pay | Admitting: Internal Medicine

## 2024-04-12 ENCOUNTER — Encounter (HOSPITAL_COMMUNITY)
Admission: EM | Disposition: A | Discharge status: Home / Self Care | Payer: Self-pay | Source: Home / Self Care | Attending: Emergency Medicine

## 2024-04-12 DIAGNOSIS — I2510 Atherosclerotic heart disease of native coronary artery without angina pectoris: Secondary | ICD-10-CM

## 2024-04-12 DIAGNOSIS — G4733 Obstructive sleep apnea (adult) (pediatric): Secondary | ICD-10-CM | POA: Diagnosis not present

## 2024-04-12 DIAGNOSIS — K8000 Calculus of gallbladder with acute cholecystitis without obstruction: Secondary | ICD-10-CM | POA: Diagnosis not present

## 2024-04-12 DIAGNOSIS — K8050 Calculus of bile duct without cholangitis or cholecystitis without obstruction: Secondary | ICD-10-CM

## 2024-04-12 DIAGNOSIS — I10 Essential (primary) hypertension: Secondary | ICD-10-CM

## 2024-04-12 LAB — GLUCOSE, CAPILLARY
Glucose-Capillary: 141 mg/dL — ABNORMAL HIGH (ref 70–99)
Glucose-Capillary: 237 mg/dL — ABNORMAL HIGH (ref 70–99)

## 2024-04-12 LAB — SURGICAL PCR SCREEN
MRSA, PCR: NEGATIVE
Staphylococcus aureus: NEGATIVE

## 2024-04-12 SURGERY — CHOLECYSTECTOMY, ROBOT-ASSISTED, LAPAROSCOPIC
Anesthesia: General | Site: Abdomen

## 2024-04-12 MED ORDER — ONDANSETRON HCL 4 MG/2ML IJ SOLN
INTRAMUSCULAR | Status: AC
Start: 2024-04-12 — End: 2024-04-12
  Filled 2024-04-12: qty 2

## 2024-04-12 MED ORDER — DEXAMETHASONE SODIUM PHOSPHATE 10 MG/ML IJ SOLN
INTRAMUSCULAR | Status: AC
Start: 2024-04-12 — End: 2024-04-12
  Filled 2024-04-12: qty 1

## 2024-04-12 MED ORDER — STERILE WATER FOR IRRIGATION IR SOLN
Status: DC | PRN
  Administered 2024-04-12: 500 mL

## 2024-04-12 MED ORDER — SUGAMMADEX SODIUM 200 MG/2ML IV SOLN
INTRAVENOUS | Status: DC | PRN
  Administered 2024-04-12: 400 mg via INTRAVENOUS

## 2024-04-12 MED ORDER — SUCCINYLCHOLINE CHLORIDE 200 MG/10ML IV SOSY
PREFILLED_SYRINGE | INTRAVENOUS | Status: AC
  Filled 2024-04-12: qty 10

## 2024-04-12 MED ORDER — HEMOSTATIC AGENTS (NO CHARGE) OPTIME
TOPICAL | Status: DC | PRN
  Administered 2024-04-12: 1 via TOPICAL

## 2024-04-12 MED ORDER — SUCCINYLCHOLINE CHLORIDE 200 MG/10ML IV SOSY
PREFILLED_SYRINGE | INTRAVENOUS | Status: DC | PRN
  Administered 2024-04-12: 120 mg via INTRAVENOUS

## 2024-04-12 MED ORDER — ROCURONIUM BROMIDE 10 MG/ML (PF) SYRINGE
PREFILLED_SYRINGE | INTRAVENOUS | Status: AC
  Filled 2024-04-12: qty 10

## 2024-04-12 MED ORDER — PHENYLEPHRINE 80 MCG/ML (10ML) SYRINGE FOR IV PUSH (FOR BLOOD PRESSURE SUPPORT)
PREFILLED_SYRINGE | INTRAVENOUS | Status: DC | PRN
  Administered 2024-04-12: 160 ug via INTRAVENOUS
  Administered 2024-04-12 (×5): 80 ug via INTRAVENOUS

## 2024-04-12 MED ORDER — ROCURONIUM BROMIDE 10 MG/ML (PF) SYRINGE
PREFILLED_SYRINGE | INTRAVENOUS | Status: DC | PRN
Start: 2024-04-12 — End: 2024-04-12
  Administered 2024-04-12: 10 mg via INTRAVENOUS
  Administered 2024-04-12: 40 mg via INTRAVENOUS

## 2024-04-12 MED ORDER — INDOCYANINE GREEN 25 MG IV SOLR
INTRAVENOUS | Status: AC
  Filled 2024-04-12: qty 10

## 2024-04-12 MED ORDER — ONDANSETRON HCL 4 MG/2ML IJ SOLN
INTRAMUSCULAR | Status: DC | PRN
  Administered 2024-04-12: 4 mg via INTRAVENOUS

## 2024-04-12 MED ORDER — KETOROLAC TROMETHAMINE 30 MG/ML IJ SOLN
INTRAMUSCULAR | Status: AC
  Filled 2024-04-12: qty 1

## 2024-04-12 MED ORDER — FENTANYL CITRATE PF 50 MCG/ML IJ SOSY
25.0000 ug | PREFILLED_SYRINGE | INTRAMUSCULAR | Status: DC | PRN
  Administered 2024-04-12: 50 ug via INTRAVENOUS

## 2024-04-12 MED ORDER — PHENYLEPHRINE 80 MCG/ML (10ML) SYRINGE FOR IV PUSH (FOR BLOOD PRESSURE SUPPORT)
PREFILLED_SYRINGE | INTRAVENOUS | Status: AC
Start: 2024-04-12 — End: 2024-04-12
  Filled 2024-04-12: qty 10

## 2024-04-12 MED ORDER — KETOROLAC TROMETHAMINE 30 MG/ML IJ SOLN
INTRAMUSCULAR | Status: DC | PRN
  Administered 2024-04-12: 30 mg via INTRAVENOUS

## 2024-04-12 MED ORDER — MIDAZOLAM HCL 2 MG/2ML IJ SOLN
INTRAMUSCULAR | Status: AC
  Filled 2024-04-12: qty 2

## 2024-04-12 MED ORDER — FENTANYL CITRATE (PF) 250 MCG/5ML IJ SOLN
INTRAMUSCULAR | Status: DC | PRN
  Administered 2024-04-12 (×2): 25 ug via INTRAVENOUS
  Administered 2024-04-12: 100 ug via INTRAVENOUS

## 2024-04-12 MED ORDER — LIDOCAINE 2% (20 MG/ML) 5 ML SYRINGE
INTRAMUSCULAR | Status: AC
  Filled 2024-04-12: qty 5

## 2024-04-12 MED ORDER — OXYCODONE HCL 5 MG PO TABS: 5.0000 mg | ORAL_TABLET | ORAL | 0 refills | Status: DC | PRN

## 2024-04-12 MED ORDER — BUPIVACAINE HCL (PF) 0.5 % IJ SOLN
INTRAMUSCULAR | Status: DC | PRN
  Administered 2024-04-12: 30 mL

## 2024-04-12 MED ORDER — CHLORHEXIDINE GLUCONATE 0.12 % MT SOLN
15.0000 mL | Freq: Once | OROMUCOSAL | Status: AC
  Administered 2024-04-12: 15 mL via OROMUCOSAL

## 2024-04-12 MED ORDER — DEXMEDETOMIDINE HCL IN NACL 80 MCG/20ML IV SOLN
INTRAVENOUS | Status: AC
  Filled 2024-04-12: qty 20

## 2024-04-12 MED ORDER — LIDOCAINE 2% (20 MG/ML) 5 ML SYRINGE
INTRAMUSCULAR | Status: DC | PRN
Start: 2024-04-12 — End: 2024-04-12
  Administered 2024-04-12: 60 mg via INTRAVENOUS

## 2024-04-12 MED ORDER — FENTANYL CITRATE (PF) 250 MCG/5ML IJ SOLN
INTRAMUSCULAR | Status: AC
Start: 2024-04-12 — End: 2024-04-12
  Filled 2024-04-12: qty 5

## 2024-04-12 MED ORDER — FENTANYL CITRATE PF 50 MCG/ML IJ SOSY
PREFILLED_SYRINGE | INTRAMUSCULAR | Status: AC
  Filled 2024-04-12: qty 1

## 2024-04-12 MED ORDER — PROPOFOL 10 MG/ML IV BOLUS
INTRAVENOUS | Status: AC
  Filled 2024-04-12: qty 20

## 2024-04-12 MED ORDER — ORAL CARE MOUTH RINSE: 15.0000 mL | Freq: Once | OROMUCOSAL | Status: AC

## 2024-04-12 MED ORDER — OXYCODONE HCL 5 MG/5ML PO SOLN: 5.0000 mg | Freq: Once | ORAL | Status: DC | PRN

## 2024-04-12 MED ORDER — MIDAZOLAM HCL 2 MG/2ML IJ SOLN
INTRAMUSCULAR | Status: DC | PRN
  Administered 2024-04-12: 2 mg via INTRAVENOUS

## 2024-04-12 MED ORDER — LACTATED RINGERS IV SOLN: INTRAVENOUS | Status: DC

## 2024-04-12 MED ORDER — DEXMEDETOMIDINE HCL IN NACL 80 MCG/20ML IV SOLN
INTRAVENOUS | Status: DC | PRN
  Administered 2024-04-12: 10 ug via INTRAVENOUS

## 2024-04-12 MED ORDER — DEXAMETHASONE SODIUM PHOSPHATE 10 MG/ML IJ SOLN
INTRAMUSCULAR | Status: DC | PRN
Start: 2024-04-12 — End: 2024-04-12
  Administered 2024-04-12: 5 mg via INTRAVENOUS

## 2024-04-12 MED ORDER — PROPOFOL 10 MG/ML IV BOLUS
INTRAVENOUS | Status: DC | PRN
Start: 2024-04-12 — End: 2024-04-12
  Administered 2024-04-12: 150 mg via INTRAVENOUS

## 2024-04-12 MED ORDER — OXYCODONE HCL 5 MG PO TABS: 5.0000 mg | ORAL_TABLET | Freq: Once | ORAL | Status: DC | PRN

## 2024-04-12 MED ORDER — ONDANSETRON HCL 4 MG/2ML IJ SOLN
4.0000 mg | Freq: Once | INTRAMUSCULAR | Status: DC | PRN
Start: 2024-04-12 — End: 2024-04-12

## 2024-04-12 MED ORDER — BUPIVACAINE HCL (PF) 0.5 % IJ SOLN
INTRAMUSCULAR | Status: AC
Start: 2024-04-12 — End: 2024-04-12
  Filled 2024-04-12: qty 30

## 2024-04-12 SURGICAL SUPPLY — 35 items
CAUTERY HOOK MNPLR 1.6 DVNC XI (INSTRUMENTS) ×1 IMPLANT
CHLORAPREP W/TINT 26 (MISCELLANEOUS) ×1 IMPLANT
CLIP LIGATING HEM O LOK PURPLE (MISCELLANEOUS) ×1 IMPLANT
COVER LIGHT HANDLE (MISCELLANEOUS) IMPLANT
DERMABOND ADVANCED .7 DNX12 (GAUZE/BANDAGES/DRESSINGS) ×1 IMPLANT
DRAPE ARM DVNC X/XI (DISPOSABLE) ×4 IMPLANT
DRAPE COLUMN DVNC XI (DISPOSABLE) ×1 IMPLANT
ELECTRODE REM PT RTRN 9FT ADLT (ELECTROSURGICAL) ×1 IMPLANT
FORCEPS BPLR R/ABLATION 8 DVNC (INSTRUMENTS) ×1 IMPLANT
FORCEPS PROGRASP DVNC XI (FORCEP) ×1 IMPLANT
GLOVE BIOGEL PI IND STRL 7.0 (GLOVE) ×2 IMPLANT
GLOVE SURG SS PI 7.5 STRL IVOR (GLOVE) ×2 IMPLANT
GOWN STRL REUS W/TWL LRG LVL3 (GOWN DISPOSABLE) ×3 IMPLANT
HEMOSTAT SNOW SURGICEL 2X4 (HEMOSTASIS) IMPLANT
IRRIGATOR SUCT 8 DISP DVNC XI (IRRIGATION / IRRIGATOR) IMPLANT
KIT TURNOVER KIT A (KITS) ×1 IMPLANT
MANIFOLD NEPTUNE II (INSTRUMENTS) ×1 IMPLANT
NDL HYPO 21X1.5 SAFETY (NEEDLE) ×1 IMPLANT
NDL INSUFFLATION 14GA 120MM (NEEDLE) ×1 IMPLANT
NEEDLE HYPO 21X1.5 SAFETY (NEEDLE) ×1 IMPLANT
NEEDLE INSUFFLATION 14GA 120MM (NEEDLE) ×1 IMPLANT
OBTURATOR OPTICALSTD 8 DVNC (TROCAR) ×1 IMPLANT
PACK LAP CHOLE LZT030E (CUSTOM PROCEDURE TRAY) ×1 IMPLANT
PAD ARMBOARD POSITIONER FOAM (MISCELLANEOUS) ×1 IMPLANT
PENCIL HANDSWITCHING (ELECTRODE) ×1 IMPLANT
POSITIONER HEAD 8X9X4 ADT (SOFTGOODS) ×1 IMPLANT
SEAL UNIV 5-12 XI (MISCELLANEOUS) ×4 IMPLANT
SET BASIN LINEN APH (SET/KITS/TRAYS/PACK) ×1 IMPLANT
SET TUBE SMOKE EVAC HIGH FLOW (TUBING) ×1 IMPLANT
SPIKE FLUID TRANSFER (MISCELLANEOUS) ×1 IMPLANT
SUT MNCRL AB 4-0 PS2 18 (SUTURE) ×2 IMPLANT
SUT VICRYL 0 UR6 27IN ABS (SUTURE) IMPLANT
SYR 30ML LL (SYRINGE) ×1 IMPLANT
SYSTEM RETRIEVL 5MM INZII UNIV (BASKET) ×1 IMPLANT
WATER STERILE IRR 500ML POUR (IV SOLUTION) ×1 IMPLANT

## 2024-04-12 NOTE — Anesthesia Preprocedure Evaluation (Signed)
 Anesthesia Evaluation  Patient identified by MRN, date of birth, ID band Patient awake    Reviewed: Allergy & Precautions, H&P , NPO status , Patient's Chart, lab work & pertinent test results, reviewed documented beta blocker date and time   Airway Mallampati: II  TM Distance: >3 FB Neck ROM: full    Dental no notable dental hx.    Pulmonary sleep apnea , pneumonia, Current Smoker   Pulmonary exam normal breath sounds clear to auscultation       Cardiovascular Exercise Tolerance: Good hypertension, + CAD and + Peripheral Vascular Disease   Rhythm:regular Rate:Normal     Neuro/Psych TIA negative psych ROS   GI/Hepatic negative GI ROS, Neg liver ROS,,,  Endo/Other  negative endocrine ROS    Renal/GU Renal disease  negative genitourinary   Musculoskeletal   Abdominal   Peds  Hematology negative hematology ROS (+)   Anesthesia Other Findings   Reproductive/Obstetrics negative OB ROS                             Anesthesia Physical Anesthesia Plan  ASA: 2  Anesthesia Plan: General and General ETT   Post-op Pain Management:    Induction:   PONV Risk Score and Plan: Ondansetron  Airway Management Planned:   Additional Equipment:   Intra-op Plan:   Post-operative Plan:   Informed Consent: I have reviewed the patients History and Physical, chart, labs and discussed the procedure including the risks, benefits and alternatives for the proposed anesthesia with the patient or authorized representative who has indicated his/her understanding and acceptance.     Dental Advisory Given  Plan Discussed with: CRNA  Anesthesia Plan Comments:        Anesthesia Quick Evaluation

## 2024-04-12 NOTE — Anesthesia Procedure Notes (Signed)
 Procedure Name: Intubation Date/Time: 04/12/2024 8:13 AM  Performed by: Augusta Daved SAILOR, CRNAPre-anesthesia Checklist: Patient identified, Emergency Drugs available, Suction available and Patient being monitored Patient Re-evaluated:Patient Re-evaluated prior to induction Oxygen Delivery Method: Circle System Utilized Preoxygenation: Pre-oxygenation with 100% oxygen Induction Type: IV induction Laryngoscope Size: Glidescope and 3 Grade View: Grade I Tube type: Oral Number of attempts: 1 Airway Equipment and Method: Stylet and Oral airway Placement Confirmation: ETT inserted through vocal cords under direct vision, positive ETCO2 and breath sounds checked- equal and bilateral Secured at: 22 cm Tube secured with: Tape Dental Injury: Teeth and Oropharynx as per pre-operative assessment

## 2024-04-12 NOTE — Interval H&P Note (Signed)
 History and Physical Interval Note:  04/12/2024 7:47 AM  Brett Glover  has presented today for surgery, with the diagnosis of biliary colic, cholelithiasis.  The various methods of treatment have been discussed with the patient and family. After consideration of risks, benefits and other options for treatment, the patient has consented to  Procedure(s): CHOLECYSTECTOMY, ROBOT-ASSISTED, LAPAROSCOPIC (N/A) as a surgical intervention.  The patient's history has been reviewed, patient examined, no change in status, stable for surgery.  I have reviewed the patient's chart and labs.  Questions were answered to the patient's satisfaction.     Oneil Budge

## 2024-04-12 NOTE — Discharge Summary (Signed)
 Physician Discharge Summary  Patient ID: Brett Glover MRN: [id number] DOB/AGE: 59-Aug-1967 59 y.o.  Admit date: 04/10/2024 Discharge date: 04/12/2024  Admission Diagnoses: Biliary colic secondary to cholelithiasis  Discharge Diagnoses:  Principal Problem:   Biliary colic Active Problems:   PAD (peripheral artery disease) (HCC)   CAD (coronary artery disease)   Hyperglycemia   Calculus of gallbladder with acute cholecystitis without obstruction   Discharged Condition: good  Hospital Course: Patient is a 58 year old white male who was seen in the emergency room on 04/10/2024 for right upper quadrant abdominal pain and nausea.  A CT scan of the abdomen revealed cholelithiasis with a distended gallbladder.  Ultrasound was negative for cholelithiasis.  Given his ongoing right upper quadrant abdominal pain, he underwent a robotic assisted laparoscopic cholecystectomy on 04/12/2024.  An acutely inflamed gallbladder was found.  He tolerated the procedure well.  His postoperative course has been unremarkable.  His diet was advanced without difficulty.  The patient was discharged home on 04/12/2024 in good and improving condition.  Treatments: surgery: Robotic assisted laparoscopic cholecystectomy on 04/12/2024  Discharge Exam: Blood pressure 101/69, pulse 78, temperature 98.8 F (37.1 C), temperature source Oral, resp. rate (!) 21, height 6' (1.829 m), weight 117.9 kg, SpO2 92%. General appearance: alert, cooperative, and no distress Resp: clear to auscultation bilaterally Cardio: regular rate and rhythm, S1, S2 normal, no murmur, click, rub or gallop GI: Soft, incisions healing well.  Disposition: Discharge disposition: 01-Home or Self Care       Discharge Instructions     Diet - low sodium heart healthy   Complete by: As directed    Increase activity slowly   Complete by: As directed       Allergies as of 04/12/2024   No Known Allergies      Medication List     TAKE these  medications    naproxen  sodium 220 MG tablet Commonly known as: ALEVE  Take 220 mg by mouth daily as needed.   oxyCODONE  5 MG immediate release tablet Commonly known as: Roxicodone  Take 1 tablet (5 mg total) by mouth every 4 (four) hours as needed.        Follow-up Information     Mavis Anes, MD Follow up.   Specialty: General Surgery Why: As needed.  Will call you next week for follow up. Contact information: [address] [place] [address] [phone]                 Signed: Anes Mavis 04/12/2024, 4:46 PM

## 2024-04-12 NOTE — Plan of Care (Signed)
  Problem: Activity: Goal: Risk for activity intolerance will decrease Outcome: Progressing   Problem: Pain Managment: Goal: General experience of comfort will improve and/or be controlled Outcome: Progressing   Problem: Coping: Goal: Ability to adjust to condition or change in health will improve Outcome: Progressing

## 2024-04-12 NOTE — Progress Notes (Signed)
 Mobility Specialist Progress Note:    04/12/24 1122  Mobility  Activity Ambulated with assistance in room;Ambulated with assistance to bathroom  Level of Assistance Standby assist, set-up cues, supervision of patient - no hands on  Assistive Device None  Distance Ambulated (ft) 25 ft  Range of Motion/Exercises Active;All extremities  Activity Response Tolerated well  Mobility Referral Yes  Mobility visit 1 Mobility  Mobility Specialist Start Time (ACUTE ONLY) 1100  Mobility Specialist Stop Time (ACUTE ONLY) 1118  Mobility Specialist Time Calculation (min) (ACUTE ONLY) 18 min   Pt received in bed requesting assistance to bathroom. Required SBA to stand and ambulate with no AD. Tolerated well, asx throughout. Left pt in chair, NT in room. All needs met.   Sherrilee Ditty Mobility Specialist Please contact via Special educational needs teacher or  Rehab office at [phone]

## 2024-04-12 NOTE — Transfer of Care (Signed)
 Immediate Anesthesia Transfer of Care Note  Patient: Brett Glover  Procedure(s) Performed: CHOLECYSTECTOMY, ROBOT-ASSISTED, LAPAROSCOPIC (Abdomen)  Patient Location: PACU  Anesthesia Type:General  Level of Consciousness: drowsy  Airway & Oxygen Therapy: Patient Spontanous Breathing and Patient connected to face mask oxygen  Post-op Assessment: Report given to RN and Post -op Vital signs reviewed and stable  Post vital signs: Reviewed and stable  Last Vitals:  Vitals Value Taken Time  BP 114/71 04/12/24 09:34  Temp 37.6 C 04/12/24 09:34  Pulse 80 04/12/24 09:35  Resp 22 04/12/24 09:35  SpO2 100 % 04/12/24 09:35  Vitals shown include unfiled device data.  Last Pain:  Vitals:   04/12/24 0750  TempSrc: Oral  PainSc: 0-No pain      Patients Stated Pain Goal: 4 (04/12/24 0750)  Complications: No notable events documented.

## 2024-04-12 NOTE — Op Note (Signed)
 Patient:  Brett Glover  DOB:  03/21/1966  MRN:  [id number]   Preop Diagnosis: Acute cholecystitis, cholelithiasis  Postop Diagnosis: Same  Procedure: Robotic assisted laparoscopic cholecystectomy  Surgeon: Oneil Budge, MD  Anes: General endotracheal  Indications: Patient is a 58 year old white male who presents with acute cholecystitis secondary to cholelithiasis.  The risks and benefits of the procedure including bleeding, infection, hepatobiliary injury, and the possibility of an open procedure were fully explained to the patient, who gave informed consent.  Procedure note: The patient was placed in the supine position.  After induction of general endotracheal anesthesia, the abdomen was prepped and draped using the usual sterile technique with ChloraPrep.  Surgical site confirmation was performed.  An infraumbilical incision was made down to the fascia.  A Veress needle was introduced into the abdominal cavity and confirmation of placement was done using the saline drop test.  The abdomen was insufflated to 15 mmHg pressure.  An 8 mm trocar was introduced into the abdominal cavity under direct visualization without difficulty.  The patient was placed in reverse Trendelenburg position and additional 8 mm trocars were placed in the left upper quadrant, right lower quadrant, and right flank regions.  The robot was then docked and targeted.  An inflamed tense gallbladder was found.  It was decompressed with suction.  It was then retracted in a dynamic fashion in order to provide a critical view of the triangle of Calot.  The cystic duct was first identified.  Its junction to the infundibulum was fully identified.  This was confirmed by firefly.  Hemoclips were placed proximally distally on the cystic duct and the cystic duct was divided.  This was likewise done to the cystic artery.  The gallbladder was freed away from the gallbladder fossa using Bovie electrocautery.  The gallbladder was  delivered through the left upper quadrant trocar site without difficulty.  The gallbladder fossa was inspected and no abnormal bleeding or bile leakage was noted.  Surgicel was placed in the gallbladder fossa.  All fluid and air were then evacuated from the abdominal cavity prior to the removal of the trocars.  All wounds were irrigated with normal saline.  All wounds were injected with 0.5% Sensorcaine.  The left upper quadrant fascia was reapproximated using an 0 Vicryl interrupted suture.  All skin incisions were closed using a 4-0 Monocryl subcuticular suture.  Dermabond was applied.  All tape and needle counts were correct at the end of the procedure.  The patient was extubated in the operating room and transferred to PACU in stable condition.  Complications: None  EBL: Minimal  Specimen: Gallbladder

## 2024-04-13 ENCOUNTER — Telehealth: Payer: Self-pay | Admitting: Vascular Surgery

## 2024-04-13 LAB — SURGICAL PATHOLOGY

## 2024-04-13 NOTE — Anesthesia Postprocedure Evaluation (Signed)
 Anesthesia Post Note  Patient: Brett Glover  Procedure(s) Performed: CHOLECYSTECTOMY, ROBOT-ASSISTED, LAPAROSCOPIC (Abdomen)  Patient location during evaluation: Phase II Anesthesia Type: General Level of consciousness: awake Pain management: pain level controlled Vital Signs Assessment: post-procedure vital signs reviewed and stable Respiratory status: spontaneous breathing and respiratory function stable Cardiovascular status: blood pressure returned to baseline and stable Postop Assessment: no headache and no apparent nausea or vomiting Anesthetic complications: no Comments: Late entry   No notable events documented.   Last Vitals:  Vitals:   04/12/24 1030 04/12/24 1042  BP: 98/71 101/69  Pulse: 85 78  Resp: (!) 21   Temp: 37.2 C 37.1 C  SpO2: 92% 92%    Last Pain:  Vitals:   04/12/24 1042  TempSrc: Oral  PainSc:                  Yvonna PARAS Bosworth

## 2024-04-14 NOTE — Telephone Encounter (Signed)
 Appt has been scheduled for 08/13

## 2024-04-21 ENCOUNTER — Encounter: Payer: Self-pay | Admitting: General Surgery

## 2024-04-21 ENCOUNTER — Ambulatory Visit: Admitting: General Surgery

## 2024-04-21 DIAGNOSIS — Z09 Encounter for follow-up examination after completed treatment for conditions other than malignant neoplasm: Secondary | ICD-10-CM

## 2024-04-21 DIAGNOSIS — K8000 Calculus of gallbladder with acute cholecystitis without obstruction: Secondary | ICD-10-CM

## 2024-04-21 NOTE — Progress Notes (Signed)
 Subjective:     Brett Glover  Virtual telephone visit performed with patient.  I was in the office.  Patient is status post a robotic assisted laparoscopic cholecystectomy.  He states he is doing well.  He has minimal incisional pain.  He is resuming normal activity.  He denies any fever or chills. Objective:    There were no vitals taken for this visit.  General:  alert and cooperative  Final pathology consistent with diagnosis.     Assessment:    Doing well postoperatively.    Plan:   Patient was told he could resume normal activity.  Should he have any issues, he was instructed to call my office.  Final pathology was discussed with patient.  Follow-up here as needed.  As this was a part of the total global surgical fee, this was not a billable visit.  Total telephone time was 1 minute.

## 2024-05-16 ENCOUNTER — Other Ambulatory Visit: Payer: Self-pay | Admitting: *Deleted

## 2024-05-16 DIAGNOSIS — I739 Peripheral vascular disease, unspecified: Secondary | ICD-10-CM

## 2024-06-01 ENCOUNTER — Ambulatory Visit (INDEPENDENT_AMBULATORY_CARE_PROVIDER_SITE_OTHER): Admitting: Vascular Surgery

## 2024-06-01 ENCOUNTER — Ambulatory Visit (HOSPITAL_COMMUNITY)
Admission: RE | Admit: 2024-06-01 | Discharge: 2024-06-01 | Disposition: A | Discharge status: Home / Self Care | Source: Ambulatory Visit | Attending: Vascular Surgery | Admitting: Vascular Surgery

## 2024-06-01 ENCOUNTER — Encounter: Payer: Self-pay | Admitting: Vascular Surgery

## 2024-06-01 VITALS — BP 142/82 | HR 75 | Temp 98.1°F | Resp 20 | Ht 72.0 in | Wt 265.1 lb

## 2024-06-01 DIAGNOSIS — I70213 Atherosclerosis of native arteries of extremities with intermittent claudication, bilateral legs: Secondary | ICD-10-CM | POA: Insufficient documentation

## 2024-06-01 DIAGNOSIS — I739 Peripheral vascular disease, unspecified: Secondary | ICD-10-CM | POA: Insufficient documentation

## 2024-06-01 DIAGNOSIS — I7409 Other arterial embolism and thrombosis of abdominal aorta: Secondary | ICD-10-CM | POA: Insufficient documentation

## 2024-06-01 LAB — VAS US ABI WITH/WO TBI
Left ABI: 0.89
Right ABI: 0.74

## 2024-06-01 MED ORDER — ROSUVASTATIN CALCIUM 10 MG PO TABS: 10.0000 mg | ORAL_TABLET | Freq: Every day | ORAL | 3 refills | Status: AC

## 2024-06-01 NOTE — Progress Notes (Signed)
 Patient ID: Brett Glover, male   DOB: [birth year], 58 y.o.   MRN: [id number]  Reason for Consult: New Patient (Initial Visit)   Referred by Sheree Penne Palma*  Subjective:     HPI:  Brett Glover is a 58 y.o. male with history of hyperlipidemia and formerly a tobacco smoker.  He has elevated cholesterol as well but does not take any medications.  He specifically does not take aspirin  either.  He was recently hospitalized with cholecystitis and underwent cholecystectomy.  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  He states that he does have pain in his legs particularly his right hip when walking at work especially when he is walking up hills or carrying heavy equipment.  He does not have any tissue loss or rest pain.  He really does not have any claudication in his calfs and less he walks for great distances particularly when he is shopping at [employer].  He does have some issues with erectile dysfunction that have worsened with time.  He does not take any antiplatelet medications.  Past Medical History:  Diagnosis Date   Broken jaw (HCC) 10/20/1981   Cervical vertebral closed fracture (HCC)    Chicken pox as a child   Elevated cholesterol    History of cervical fracture    Hyperlipidemia    Kidney stones 10/20/1988   Peripheral vascular disease (HCC)    Tobacco abuse 05/19/2012   Family History  Problem Relation Age of Onset   Seizures Mother    ADD / ADHD Daughter    Mental illness Daughter        bipolar, add   Heart disease Sister    Past Surgical History:  Procedure Laterality Date   CERVICAL FUSION     fatty tissue removed  2007   left abdominal wall, lipoma   fusion in neck  2004   kidney stones removed  1990    Short Social History:  Social History   Tobacco Use   Smoking status: Former    Current packs/day: 0.00    Average packs/day: 1 pack/day for 30.0 years (30.0 ttl pk-yrs)    Types: Cigarettes    Quit date: 2014     Years since quitting: 11.6   Smokeless tobacco: Not on file  Substance Use Topics   Alcohol use: Yes    No Known Allergies  Current Outpatient Medications  Medication Sig Dispense Refill   naproxen  sodium (ALEVE ) 220 MG tablet Take 220 mg by mouth daily as needed.     No current facility-administered medications for this visit.    Review of Systems  Constitutional:  Constitutional negative. HENT: HENT negative.  Eyes: Eyes negative.  Respiratory: Respiratory negative.  Cardiovascular: Cardiovascular negative.  GI: Gastrointestinal negative.  Musculoskeletal: Positive for leg pain.  Skin: Skin negative.  Neurological: Neurological negative. Hematologic: Hematologic/lymphatic negative.  Psychiatric: Psychiatric negative.        Objective:  Objective   Vitals:   06/01/24 1044  BP: (!) 142/82  Pulse: 75  Resp: 20  Temp: 98.1 F (36.7 C)  TempSrc: Temporal  SpO2: 95%  Weight: 265 lb 1.6 oz (120.2 kg)  Height: 6' (1.829 m)   Body mass index is 35.95 kg/m.  Physical Exam HENT:     Head: Normocephalic.  Cardiovascular:     Pulses:          Femoral pulses are 0 on the right side and 0 on the left side. Abdominal:  General: Abdomen is flat.  Musculoskeletal:        General: Normal range of motion.     Right lower leg: No edema.     Left lower leg: No edema.  Skin:    General: Skin is warm.     Capillary Refill: Capillary refill takes 2 to 3 seconds.  Neurological:     General: No focal deficit present.     Mental Status: He is alert.  Psychiatric:        Mood and Affect: Mood normal.     Data: ABI Findings:  +---------+------------------+-----+----------+--------+  Right   Rt Pressure (mmHg)IndexWaveform  Comment   +---------+------------------+-----+----------+--------+  Brachial 144                                        +---------+------------------+-----+----------+--------+  PTA     110               0.74 monophasic           +---------+------------------+-----+----------+--------+  DP      110               0.74 monophasic          +---------+------------------+-----+----------+--------+  Great Toe103               0.70 Abnormal            +---------+------------------+-----+----------+--------+   +---------+------------------+-----+-----------+-------+  Left    Lt Pressure (mmHg)IndexWaveform   Comment  +---------+------------------+-----+-----------+-------+  Brachial 148                                        +---------+------------------+-----+-----------+-------+  PTA     132               0.89 multiphasic         +---------+------------------+-----+-----------+-------+  DP      102               0.69 monophasic          +---------+------------------+-----+-----------+-------+  Great Toe126               0.85 Normal              +---------+------------------+-----+-----------+-------+   +-------+-----------+-----------+------------+------------+  ABI/TBIToday's ABIToday's TBIPrevious ABIPrevious TBI  +-------+-----------+-----------+------------+------------+  Right .74        .70                                  +-------+-----------+-----------+------------+------------+  Left  .89        .85                                  +-------+-----------+-----------+------------+------------+           Summary:  Right: Resting right ankle-brachial index indicates moderate right lower  extremity arterial disease. The right toe-brachial index is normal.   Left: Resting left ankle-brachial index indicates mild left lower  extremity arterial disease. The left toe-brachial index is normal.       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  He does have some calf  claudication with long distance walking but currently he is managing his symptoms.  We reviewed his most recent CT scan together today and we reviewed that the best revascularization option for him would be aortobifemoral bypass particularly given the heavily calcified and sclerotic right common iliac artery and his young age.  We discussed the need for aspirin  daily and I have sent Crestor  as well to his pharmacy and he should follow-up with his primary care doctor for lipid checks.  We discussed continued walking program and he demonstrates good understanding.  I will see him back in 1 year with ABIs unless he has issues prior we would likely need to repeat CT angio prior to considering any surgical intervention.  All questions were answered with the patient and his wife today and they demonstrated understanding.   Penne Lonni Colorado MD [facility]
